# Patient Record
Sex: Male | Born: 2000 | Race: Black or African American | Hispanic: No | Marital: Single | Smoking: Never smoker
Health system: Southern US, Community
[De-identification: ages and names within clinical notes are randomized; demographics above are authoritative.]

## PROBLEM LIST (undated history)

## (undated) HISTORY — PX: HERNIA REPAIR: SHX51

---
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
## 2018-01-30 ENCOUNTER — Other Ambulatory Visit: Payer: Self-pay

## 2018-01-30 ENCOUNTER — Ambulatory Visit (HOSPITAL_COMMUNITY)
Admission: EM | Admit: 2018-01-30 | Discharge: 2018-01-30 | Disposition: A | Discharge status: Home / Self Care | Payer: Self-pay | Attending: Family Medicine | Admitting: Family Medicine

## 2018-01-30 ENCOUNTER — Encounter (HOSPITAL_COMMUNITY): Payer: Self-pay

## 2018-01-30 DIAGNOSIS — Z202 Contact with and (suspected) exposure to infections with a predominantly sexual mode of transmission: Secondary | ICD-10-CM | POA: Insufficient documentation

## 2018-01-30 DIAGNOSIS — Z7251 High risk heterosexual behavior: Secondary | ICD-10-CM

## 2018-01-30 DIAGNOSIS — Z113 Encounter for screening for infections with a predominantly sexual mode of transmission: Secondary | ICD-10-CM | POA: Insufficient documentation

## 2018-01-30 MED ORDER — AZITHROMYCIN 250 MG PO TABS
1000.0000 mg | ORAL_TABLET | Freq: Once | ORAL | Status: AC
  Administered 2018-01-30: 1000 mg via ORAL

## 2018-01-30 MED ORDER — AZITHROMYCIN 250 MG PO TABS
ORAL_TABLET | ORAL | Status: AC
  Filled 2018-01-30: qty 4

## 2018-01-30 MED ORDER — CEFTRIAXONE SODIUM 250 MG IJ SOLR
INTRAMUSCULAR | Status: AC
  Filled 2018-01-30: qty 250

## 2018-01-30 MED ORDER — CEFTRIAXONE SODIUM 250 MG IJ SOLR
250.0000 mg | Freq: Once | INTRAMUSCULAR | Status: AC
  Administered 2018-01-30: 250 mg via INTRAMUSCULAR

## 2018-01-30 MED ORDER — LIDOCAINE HCL (PF) 1 % IJ SOLN
INTRAMUSCULAR | Status: AC
  Filled 2018-01-30: qty 2

## 2018-01-30 NOTE — Discharge Instructions (Addendum)
Given rocephin 250mg  injection and azithromycin 1g in office Urine cytology obtained Declines HIV/ syphilis testing today We will follow up with you regarding the results of your test If tests are positive, please abstain from sexual activity for at least 7 days and notify partners Follow up with PCP or [facility] if symptoms persists Return here or go to ER if you have any new or worsening symptoms

## 2018-01-30 NOTE — ED Provider Notes (Signed)
[facility]   [id number] 01/30/18 Arrival Time: 1626   [id number] to STD  SUBJECTIVE:  Christopher Larsen is a 17 y.o. male who presents requesting STI screening.  Last unprotected sexual encounter on 01/27/18.  Sexually active with 1 male partner.  Currently asymptomatic.  Partner told patient she was positive for chlamydia.  Denies fever, chills, nausea, vomiting, abdominal pain, urinary symptoms, penile itching, penile rashes or lesions, testicular pain, or testicular swelling.    No LMP for male patient.  ROS: As per HPI.  History reviewed. No pertinent past medical history. Past Surgical History:  Procedure Laterality Date  . HERNIA REPAIR     No Known Allergies No current facility-administered medications on file prior to encounter.    Current Outpatient Medications on File Prior to Encounter  Medication Sig Dispense Refill  . hydrocortisone 2.5 % lotion Apply topically 2 (two) times daily. 59 mL 0  . ibuprofen (ADVIL,MOTRIN) 600 MG tablet Take 1 tablet (600 mg total) by mouth every 6 (six) hours as needed. 30 tablet 0  . neomycin-bacitracin-polymyxin (NEOSPORIN) ointment Apply 1 application topically daily as needed for wound care. apply to eye     Social History   Socioeconomic History  . Marital status: Single    Spouse name: Not on file  . Number of children: Not on file  . Years of education: Not on file  . Highest education level: Not on file  Occupational History  . Not on file  Social Needs  . Financial resource strain: Not on file  . Food insecurity:    Worry: Not on file    Inability: Not on file  . Transportation needs:    Medical: Not on file    Non-medical: Not on file  Tobacco Use  . Smoking status: Never Smoker  . Smokeless tobacco: Never Used  Substance and Sexual Activity  . Alcohol use: No  . Drug use: No  . Sexual activity: Not on file  Lifestyle  . Physical activity:    Days per week: Not on file    Minutes per session: Not  on file  . Stress: Not on file  Relationships  . Social connections:    Talks on phone: Not on file    Gets together: Not on file    Attends religious service: Not on file    Active member of club or organization: Not on file    Attends meetings of clubs or organizations: Not on file    Relationship status: Not on file  . Intimate partner violence:    Fear of current or ex partner: Not on file    Emotionally abused: Not on file    Physically abused: Not on file    Forced sexual activity: Not on file  Other Topics Concern  . Not on file  Social History Narrative  . Not on file   History reviewed. No pertinent family history.  OBJECTIVE:  Vitals:   01/30/18 1708  BP: 105/66  Pulse: 63  Resp: 16  Temp: 97.8 F (36.6 C)  TempSrc: Oral  SpO2: 99%     General appearance: alert, cooperative, appears stated age and no distress Throat: lips, mucosa, and tongue normal; teeth and gums normal Lungs: CTA bilaterally without adventitious breath sounds Heart: regular rate and rhythm.  Radial pulses 2+ symmetrical bilaterally Back: no CVA tenderness Abdomen: soft, non-tender; bowel sounds normal Skin: warm and dry Psychological:  Alert and cooperative. Normal mood and affect.  ASSESSMENT & PLAN:  1.  STD exposure   2. Unprotected sex     Meds ordered this encounter  Medications  . cefTRIAXone (ROCEPHIN) injection 250 mg  . azithromycin (ZITHROMAX) tablet 1,000 mg    Pending: Labs Reviewed  URINE CYTOLOGY ANCILLARY ONLY   Given rocephin 250mg  injection and azithromycin 1g in office Urine cytology obtained Declines HIV/ syphilis testing today We will follow up with you regarding the results of your test If tests are positive, please abstain from sexual activity for at least 7 days and notify partners Follow up with PCP or [facility] if symptoms persists Return here or go to ER if you have any new or worsening symptoms      Reviewed expectations re:  course of current medical issues. Questions answered. Outlined signs and symptoms indicating need for more acute intervention. Patient verbalized understanding. After Visit Summary given.       Rennis Harding, PA-C 01/30/18 1728

## 2018-01-30 NOTE — ED Triage Notes (Signed)
Pt presents to [facility] for STD testing, pt has been exposed to Chlamydia, pt states partner told him she was positive, last time he had sexual intercourse was Thursday 01/27/18.pt denies any symptoms

## 2018-02-01 ENCOUNTER — Telehealth (HOSPITAL_COMMUNITY): Payer: Self-pay

## 2018-02-01 LAB — URINE CYTOLOGY ANCILLARY ONLY
Chlamydia: POSITIVE — AB
Neisseria Gonorrhea: NEGATIVE

## 2018-02-01 NOTE — Telephone Encounter (Signed)
Chlamydia is positive.  This was treated at the urgent care visit with po zithromax 1g.  Pt contacted and made aware of results. Educated pt to please refrain from sexual intercourse for 7 days to give the medicine time to work.  Sexual partners need to be notified and tested/treated.  Condoms may reduce risk of reinfection.  Recheck or followup with PCP for further evaluation if symptoms are not improving.  [facility] notified 

## 2018-05-06 ENCOUNTER — Emergency Department (HOSPITAL_COMMUNITY)
Admission: EM | Admit: 2018-05-06 | Discharge: 2018-05-06 | Disposition: A | Discharge status: Home / Self Care | Payer: Self-pay | Attending: Emergency Medicine | Admitting: Emergency Medicine

## 2018-05-06 ENCOUNTER — Encounter (HOSPITAL_COMMUNITY): Payer: Self-pay | Admitting: Emergency Medicine

## 2018-05-06 ENCOUNTER — Emergency Department (HOSPITAL_COMMUNITY): Payer: Self-pay

## 2018-05-06 DIAGNOSIS — R519 Headache, unspecified: Secondary | ICD-10-CM

## 2018-05-06 DIAGNOSIS — R51 Headache: Secondary | ICD-10-CM | POA: Insufficient documentation

## 2018-05-06 MED ORDER — SUMATRIPTAN SUCCINATE 100 MG PO TABS: 100.0000 mg | ORAL_TABLET | ORAL | 0 refills | Status: DC | PRN

## 2018-05-06 MED ORDER — METOCLOPRAMIDE HCL 5 MG/ML IJ SOLN
10.0000 mg | Freq: Once | INTRAMUSCULAR | Status: AC
  Administered 2018-05-06: 10 mg via INTRAVENOUS
  Filled 2018-05-06: qty 2

## 2018-05-06 MED ORDER — SODIUM CHLORIDE 0.9 % IV BOLUS
1000.0000 mL | Freq: Once | INTRAVENOUS | Status: AC
  Administered 2018-05-06: 1000 mL via INTRAVENOUS

## 2018-05-06 MED ORDER — KETOROLAC TROMETHAMINE 30 MG/ML IJ SOLN
30.0000 mg | Freq: Once | INTRAMUSCULAR | Status: AC
  Administered 2018-05-06: 30 mg via INTRAVENOUS
  Filled 2018-05-06: qty 1

## 2018-05-06 MED ORDER — DIPHENHYDRAMINE HCL 50 MG/ML IJ SOLN
25.0000 mg | Freq: Once | INTRAMUSCULAR | Status: AC
  Administered 2018-05-06: 25 mg via INTRAVENOUS
  Filled 2018-05-06: qty 1

## 2018-05-06 NOTE — Discharge Instructions (Addendum)
CT scan showed no serious findings.  Can take Tylenol and/or ibuprofen for headache.  Prescription for Imitrex.  Make sure you read the package insert before taking the medicine.

## 2018-05-06 NOTE — ED Provider Notes (Signed)
[facility] EMERGENCY DEPARTMENT Provider Note   CSN: [id number] Arrival date & time: 05/06/18  1050     History   Chief Complaint Chief Complaint  Patient presents with  . Headache    HPI Christopher Larsen is a 17 y.o. male.  Intermittent right sided headache for 1 month.  Symptoms initially originated around the right eye and have migrated to the right temple area.  No obvious injury or aggravating circumstances.  He is sensitive to light and sound and describes an aura prior to pain onset.  No stiff neck or extremity numbness or weakness.  He is normally healthy.  Severity is moderate.  Nothing makes symptoms better or worse.     History reviewed. No pertinent past medical history.  Patient Active Problem List   Diagnosis Date Noted  . DIZZINESS 08/13/2008  . DYSARTHRIA 08/13/2008  . HEADACHE 08/06/2008  . DERMATITIS, ATOPIC 05/19/2007  . RHINITIS, ALLERGIC 07/29/2006    Past Surgical History:  Procedure Laterality Date  . HERNIA REPAIR          Home Medications    Prior to Admission medications   Medication Sig Start Date End Date Taking? Authorizing Provider  hydrocortisone 2.5 % lotion Apply topically 2 (two) times daily. 02/20/15   Santiago Glad, PA-C  ibuprofen (ADVIL,MOTRIN) 600 MG tablet Take 1 tablet (600 mg total) by mouth every 6 (six) hours as needed. 01/18/16   Sam, Ace Gins, PA-C  neomycin-bacitracin-polymyxin (NEOSPORIN) ointment Apply 1 application topically daily as needed for wound care. apply to eye    [provider]  SUMAtriptan (IMITREX) 100 MG tablet Take 1 tablet (100 mg total) by mouth every 2 (two) hours as needed for migraine. May repeat in 2 hours if headache persists or recurs. 05/06/18   Donnetta Hutching, MD    Family History History reviewed. No pertinent family history.  Social History Social History   Tobacco Use  . Smoking status: Never Smoker  . Smokeless tobacco: Never Used  Substance Use Topics    . Alcohol use: No  . Drug use: No     Allergies   Patient has no known allergies.   Review of Systems Review of Systems  All other systems reviewed and are negative.    Physical Exam Updated Vital Signs BP 107/61 (BP Location: Right Arm)   Pulse 79   Temp 98.5 F (36.9 C) (Oral)   Resp 16   SpO2 100%   Physical Exam  Constitutional: He is oriented to person, place, and time.  Photophobic  HENT:  Head: Normocephalic and atraumatic.  Eyes: Conjunctivae are normal.  Neck: Neck supple.  Cardiovascular: Normal rate and regular rhythm.  Pulmonary/Chest: Effort normal and breath sounds normal.  Abdominal: Soft. Bowel sounds are normal.  Musculoskeletal: Normal range of motion.  Neurological: He is alert and oriented to person, place, and time.  Skin: Skin is warm and dry.  Psychiatric: He has a normal mood and affect. His behavior is normal.  Nursing note and vitals reviewed.    ED Treatments / Results  Labs (all labs ordered are listed, but only abnormal results are displayed) Labs Reviewed - No data to display  EKG None  Radiology Ct Head Wo Contrast  Result Date: 05/06/2018 CLINICAL DATA:  17 year old male with acute RIGHT headache for several weeks. EXAM: CT HEAD WITHOUT CONTRAST TECHNIQUE: Contiguous axial images were obtained from the base of the skull through the vertex without intravenous contrast. COMPARISON:  09/05/2008 CT FINDINGS: Brain:  No evidence of acute infarction, hemorrhage, hydrocephalus, extra-axial collection or mass lesion/mass effect. Vascular: No hyperdense vessel or unexpected calcification. Skull: Normal. Negative for fracture or focal lesion. Sinuses/Orbits: No acute finding. Other: None. IMPRESSION: Unremarkable noncontrast head CT Electronically Signed   By: Harmon PierJeffrey  Hu M.D.   On: 05/06/2018 13:18    Procedures Procedures (including critical care time)  Medications Ordered in ED Medications  sodium chloride 0.9 % bolus 1,000 mL  (1,000 mLs Intravenous New Bag/Given 05/06/18 1233)  metoCLOPramide (REGLAN) injection 10 mg (10 mg Intravenous Given 05/06/18 1237)  ketorolac (TORADOL) 30 MG/ML injection 30 mg (30 mg Intravenous Given 05/06/18 1236)  diphenhydrAMINE (BENADRYL) injection 25 mg (25 mg Intravenous Given 05/06/18 1233)     Initial Impression / Assessment and Plan / ED Course  I have reviewed the triage vital signs and the nursing notes.  Pertinent labs & imaging results that were available during my care of the patient were reviewed by me and considered in my medical decision making (see chart for details).     Patient presents with headache on the right side of the face.  No obvious neurological deficits.  CT head negative.  He responded well to IV fluids, IV Reglan, IV Benadryl, IV Toradol.  Discharge medication Imitrex.  Discussed findings with the patient.  Final Clinical Impressions(s) / ED Diagnoses   Final diagnoses:  Intractable headache, unspecified chronicity pattern, unspecified headache type    ED Discharge Orders         Ordered    SUMAtriptan (IMITREX) 100 MG tablet  Every 2 hours PRN     05/06/18 1426           Donnetta Hutchingook, Vincy Feliz, MD 05/06/18 1524

## 2018-05-06 NOTE — ED Triage Notes (Signed)
Pt presents to ED for assessment of frontal headaches over and behind his right eye since the beginning of November.  Denies any known injury.  C/o sensitivity to light and sound, and aura prior to intense pain coming on.

## 2018-05-06 NOTE — ED Notes (Signed)
Patient transported to CT 

## 2019-10-27 ENCOUNTER — Encounter (HOSPITAL_COMMUNITY): Payer: Self-pay | Admitting: Emergency Medicine

## 2019-10-27 ENCOUNTER — Emergency Department (HOSPITAL_COMMUNITY): Payer: Self-pay

## 2019-10-27 ENCOUNTER — Other Ambulatory Visit: Payer: Self-pay

## 2019-10-27 ENCOUNTER — Emergency Department (HOSPITAL_COMMUNITY)
Admission: EM | Admit: 2019-10-27 | Discharge: 2019-10-27 | Disposition: A | Discharge status: Home / Self Care | Payer: Self-pay | Attending: Emergency Medicine | Admitting: Emergency Medicine

## 2019-10-27 DIAGNOSIS — Z5321 Procedure and treatment not carried out due to patient leaving prior to being seen by health care provider: Secondary | ICD-10-CM | POA: Insufficient documentation

## 2019-10-27 DIAGNOSIS — R0602 Shortness of breath: Secondary | ICD-10-CM | POA: Insufficient documentation

## 2019-10-27 DIAGNOSIS — R0789 Other chest pain: Secondary | ICD-10-CM | POA: Insufficient documentation

## 2019-10-27 LAB — BASIC METABOLIC PANEL
Anion gap: 9 (ref 5–15)
BUN: 16 mg/dL (ref 6–20)
CO2: 24 mmol/L (ref 22–32)
Calcium: 9.3 mg/dL (ref 8.9–10.3)
Chloride: 106 mmol/L (ref 98–111)
Creatinine, Ser: 0.95 mg/dL (ref 0.61–1.24)
GFR calc Af Amer: >60 mL/min (ref >60)
GFR calc non Af Amer: >60 mL/min (ref >60)
Glucose, Bld: 81 mg/dL (ref 70–99)
Potassium: 3.6 mmol/L (ref 3.5–5.1)
Sodium: 139 mmol/L (ref 135–145)

## 2019-10-27 LAB — CBC
HCT: 48.2 % (ref 39.0–52.0)
Hemoglobin: 15.6 g/dL (ref 13.0–17.0)
MCH: 30.6 pg (ref 26.0–34.0)
MCHC: 32.4 g/dL (ref 30.0–36.0)
MCV: 94.7 fL (ref 80.0–100.0)
Platelets: 161 10*3/uL (ref 150–400)
RBC: 5.09 MIL/uL (ref 4.22–5.81)
RDW: 12.3 % (ref 11.5–15.5)
WBC: 4.8 10*3/uL (ref 4.0–10.5)
nRBC: 0 % (ref 0.0–0.2)

## 2019-10-27 LAB — TROPONIN I (HIGH SENSITIVITY): Troponin I (High Sensitivity): 2 ng/L (ref <18)

## 2019-10-27 MED ORDER — SODIUM CHLORIDE 0.9% FLUSH: 3.0000 mL | Freq: Once | INTRAVENOUS | Status: DC

## 2019-10-27 NOTE — ED Triage Notes (Signed)
Pt c/o left side chest pains that started today causing SOB.

## 2020-06-13 ENCOUNTER — Ambulatory Visit: Payer: Self-pay | Admitting: Internal Medicine

## 2020-06-13 DIAGNOSIS — Z0289 Encounter for other administrative examinations: Secondary | ICD-10-CM

## 2020-09-23 IMAGING — CT CT HEAD W/O CM
4 series · 16 of 47 positions shown, 18 images · non-contrast
Comparison: 09/05/2008 CT

CLINICAL DATA: 17-year-old male with acute RIGHT headache for
several weeks.

EXAM:
CT HEAD WITHOUT CONTRAST
TECHNIQUE: Contiguous axial images were obtained from the base of the skull
through the vertex without intravenous contrast.

[Series 4: head without · axial · non-contrast · 0.46mm/px · z∈[-128,-8]mm · 7 of 34 slices shown, 9 images]
[im 5/34  brain]
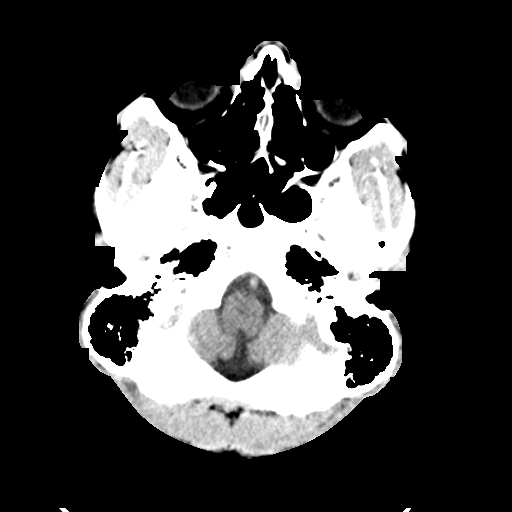
[im 5/34  bone]
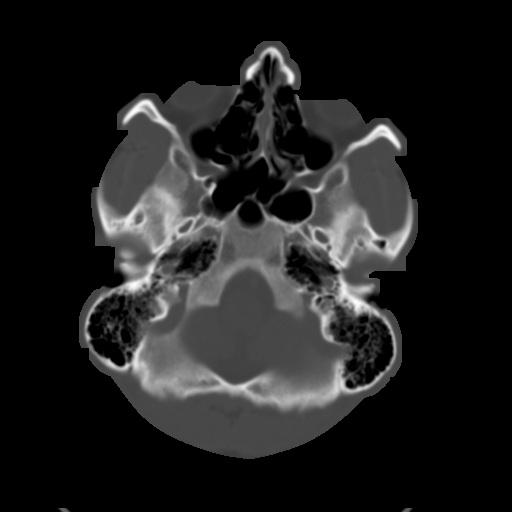
[im 9/34  brain]
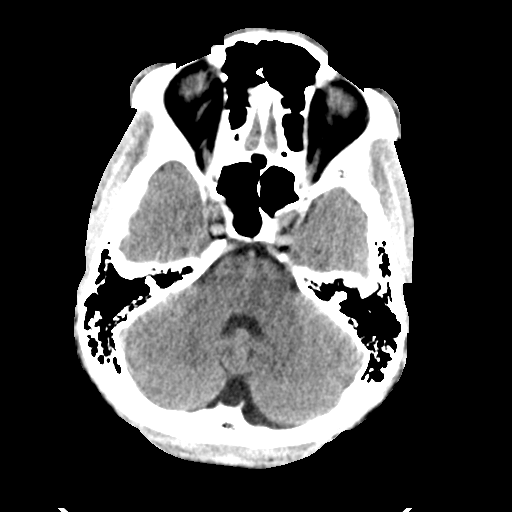
[im 13/34  brain]
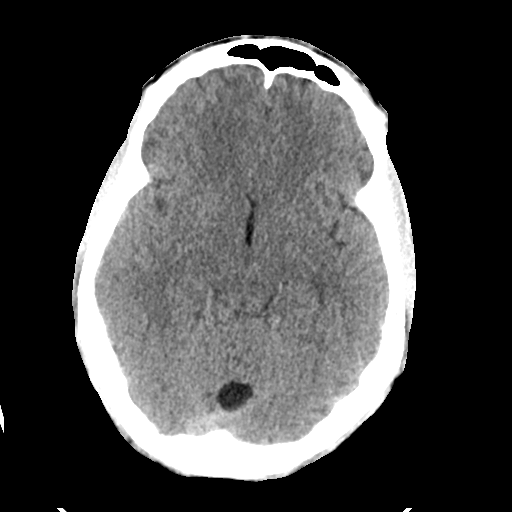
[im 17/34  brain]
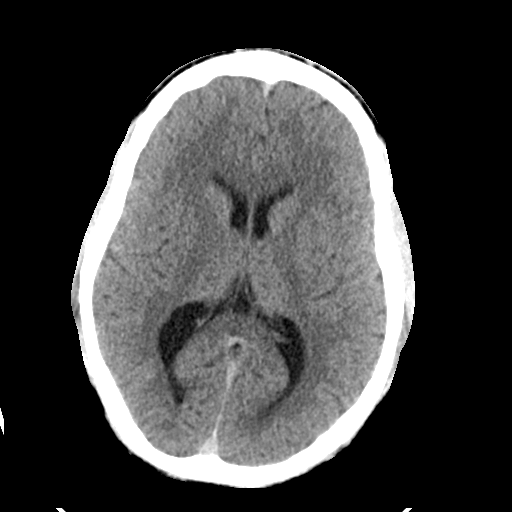
[im 21/34  brain]
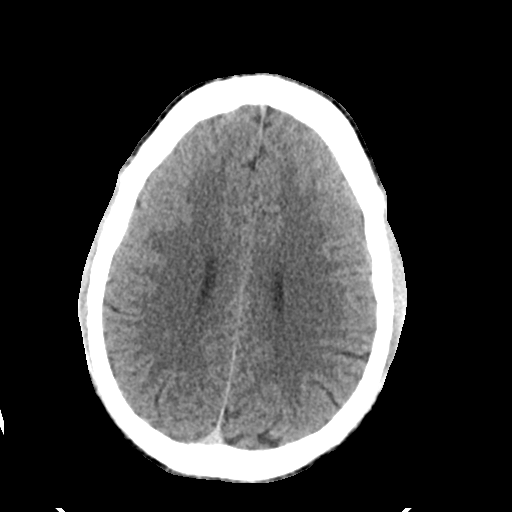
[im 21/34  bone]
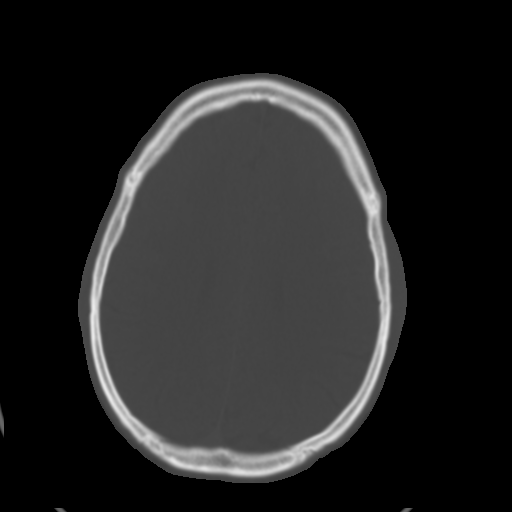
[im 25/34  brain]
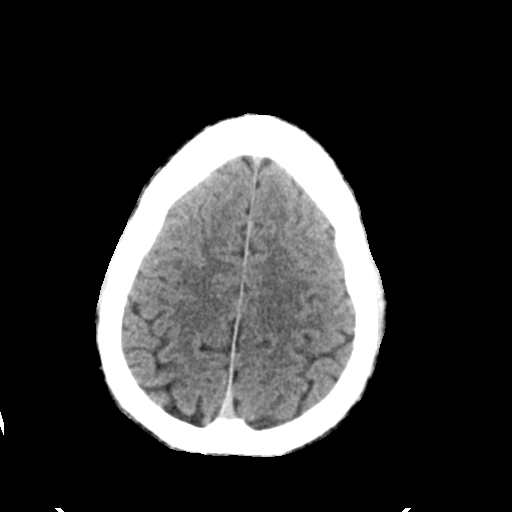
[im 29/34  brain]
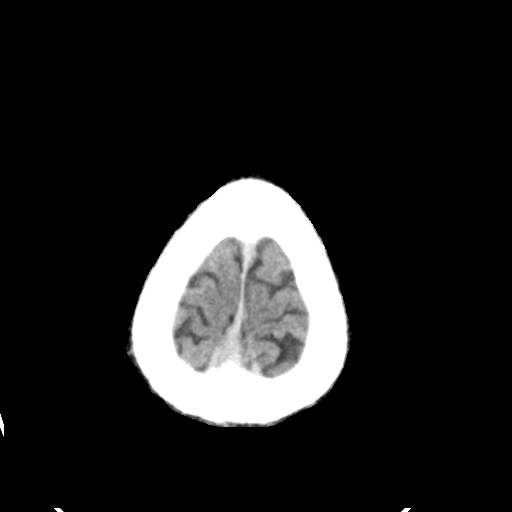

[Series 5: head bone · axial · 0.46mm/px · z∈[-132,-98]mm · 3 of 85 slices shown]
[im 9/85  bone]
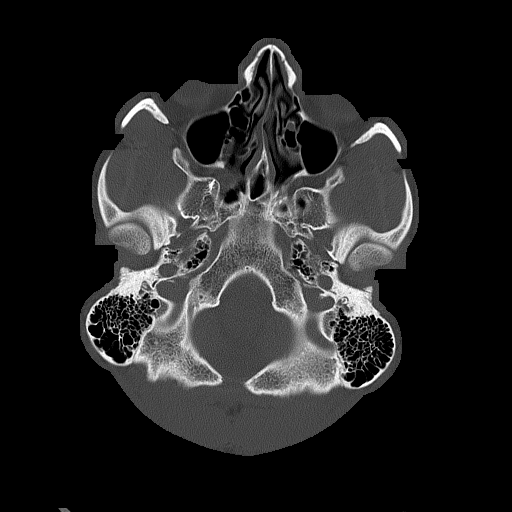
[im 17/85  bone]
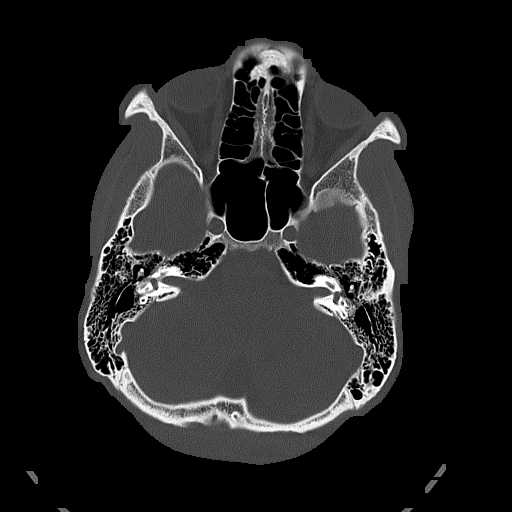
[im 26/85  bone]
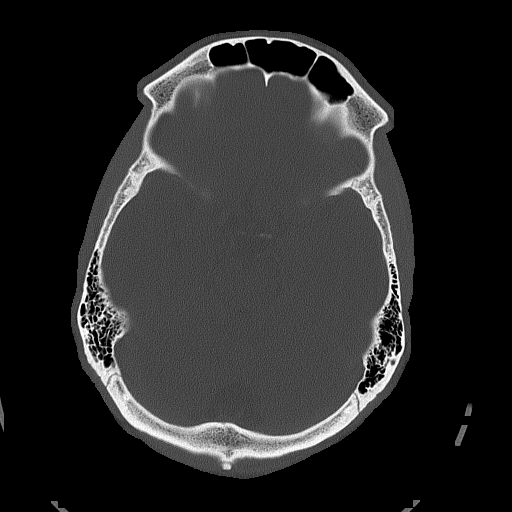

[Series 6: head without cor · coronal · non-contrast · 0.35mm/px · 3 of 81 slices shown]
[im 27/81  brain]
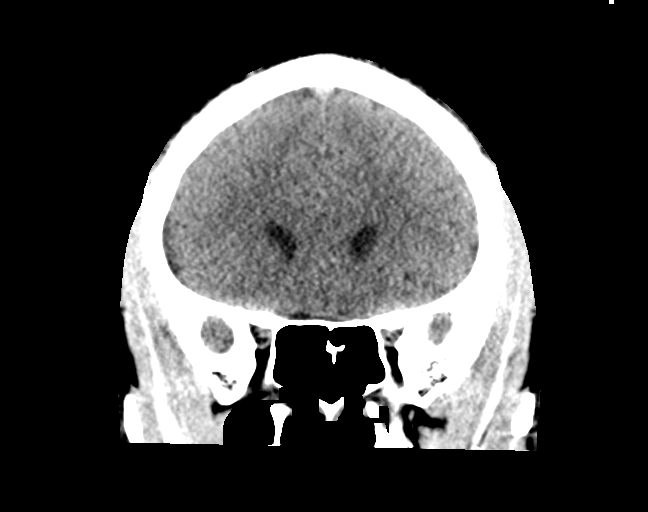
[im 36/81  brain]
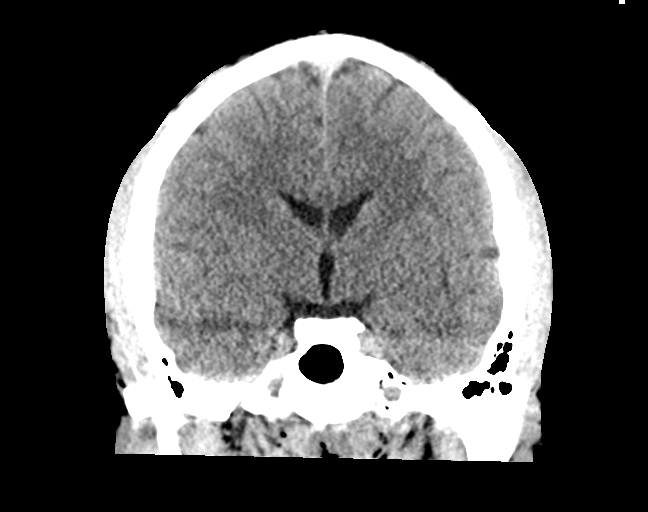
[im 45/81  brain]
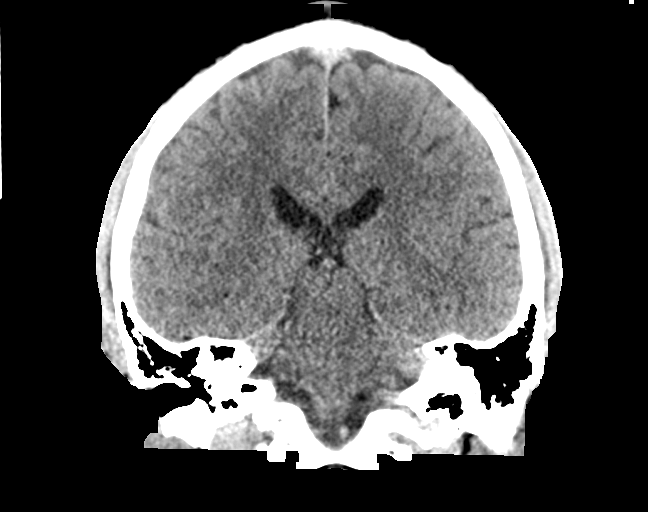

[Series 7: head without sag · sagittal · non-contrast · 0.35mm/px · 3 of 67 slices shown]
[im 23/67  brain]
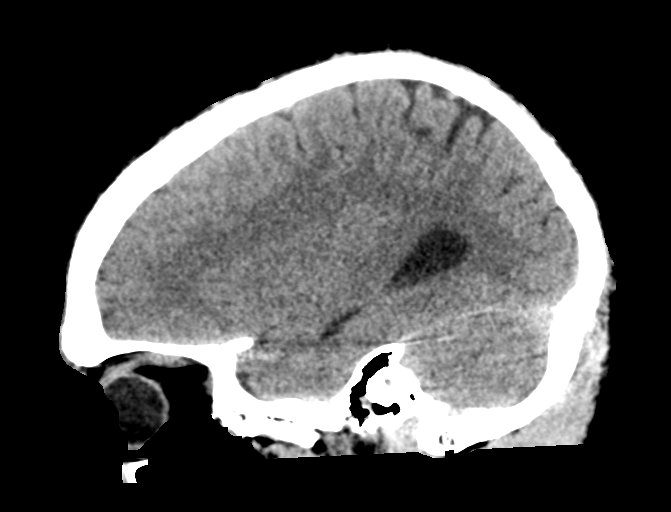
[im 34/67  brain]
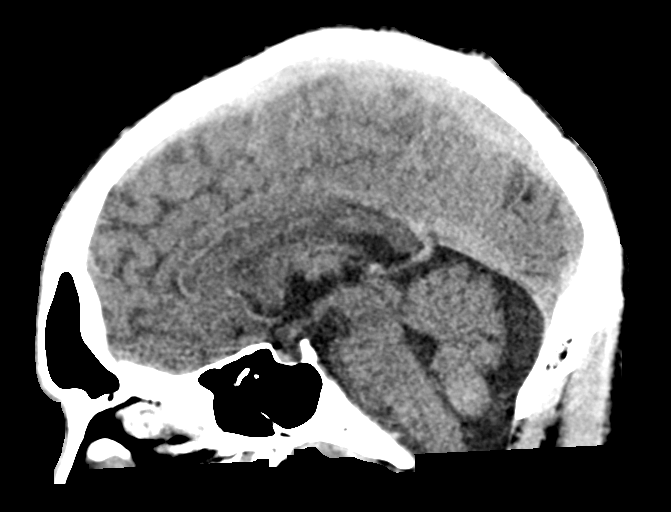
[im 45/67  brain]
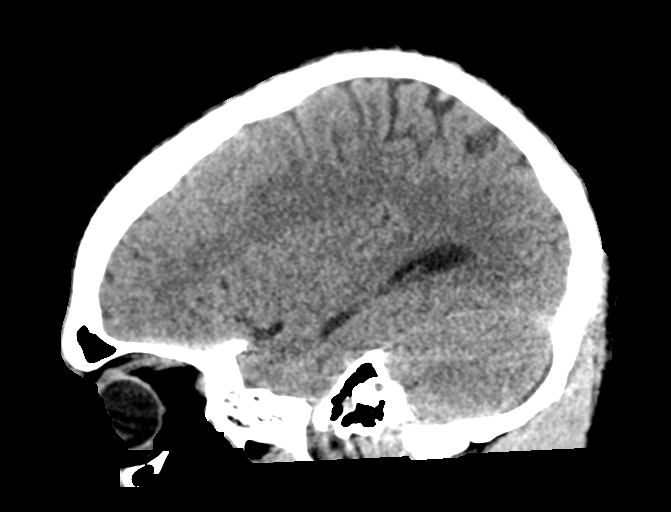

[16 of 47 positions shown; findings below may reference images not displayed]

FINDINGS: Brain: No evidence of acute infarction, hemorrhage, hydrocephalus,
extra-axial collection or mass lesion/mass effect.

Vascular: No hyperdense vessel or unexpected calcification.

Skull: Normal. Negative for fracture or focal lesion.

Sinuses/Orbits: No acute finding.

Other: None.
IMPRESSION: Unremarkable noncontrast head CT

## 2020-10-07 ENCOUNTER — Encounter (HOSPITAL_COMMUNITY): Payer: Self-pay

## 2020-10-07 ENCOUNTER — Ambulatory Visit (HOSPITAL_COMMUNITY)
Admission: EM | Admit: 2020-10-07 | Discharge: 2020-10-07 | Disposition: A | Discharge status: Home / Self Care | Payer: Self-pay | Attending: Family Medicine | Admitting: Family Medicine

## 2020-10-07 ENCOUNTER — Other Ambulatory Visit: Payer: Self-pay

## 2020-10-07 DIAGNOSIS — Z113 Encounter for screening for infections with a predominantly sexual mode of transmission: Secondary | ICD-10-CM | POA: Insufficient documentation

## 2020-10-07 NOTE — Discharge Instructions (Addendum)
We have sent testing for sexually transmitted infections. We will notify you of any positive results once they are received. If required, we will prescribe any medications you might need.  Please refrain from all sexual activity for at least the next seven days.  

## 2020-10-07 NOTE — ED Triage Notes (Signed)
Pt is here today to be tested for STD. He denies sxs.

## 2020-10-08 LAB — CYTOLOGY, (ORAL, ANAL, URETHRAL) ANCILLARY ONLY
Chlamydia: NEGATIVE
Comment: NEGATIVE
Comment: NEGATIVE
Comment: NORMAL
Neisseria Gonorrhea: NEGATIVE
Trichomonas: POSITIVE — AB

## 2020-10-09 ENCOUNTER — Telehealth (HOSPITAL_COMMUNITY): Payer: Self-pay | Admitting: Family Medicine

## 2020-10-09 ENCOUNTER — Telehealth (HOSPITAL_COMMUNITY): Payer: Self-pay | Admitting: Emergency Medicine

## 2020-10-09 MED ORDER — METRONIDAZOLE 500 MG PO TABS: 500.0000 mg | ORAL_TABLET | Freq: Two times a day (BID) | ORAL | 0 refills | Status: DC

## 2020-10-09 NOTE — Telephone Encounter (Signed)
Error

## 2020-10-09 NOTE — ED Provider Notes (Signed)
  [facility]   [id number] 10/07/20 Arrival Time: 1131  ASSESSMENT & PLAN:  1. Screening for STDs (sexually transmitted diseases)    No empiric tx warranted.   Discharge Instructions     We have sent testing for sexually transmitted infections. We will notify you of any positive results once they are received. If required, we will prescribe any medications you might need.  Please refrain from all sexual activity for at least the next seven days.     Pending: Urethral cytology.  Will notify of any positive results. Instructed to refrain from sexual activity for at least seven days.  Reviewed expectations re: course of current medical issues. Questions answered. Outlined signs and symptoms indicating need for more acute intervention. Patient verbalized understanding. After Visit Summary given.   SUBJECTIVE:  Christopher Larsen is a 20 y.o. male who requests STD screening. No symptoms. Afebrile. No abdominal or pelvic pain. No n/v. No rashes or lesions. Reports that he is sexually active. OTC treatment: none. History of STI: none reported.    OBJECTIVE:  Vitals:   10/07/20 1245  BP: 130/78  Pulse: 82  Resp: 19  Temp: 97.8 F (36.6 C)  TempSrc: Oral  SpO2: 98%     General appearance: alert, cooperative, appears stated age and no distress Lungs: unlabored respirations; speaks full sentences without difficulty Abdomen: soft, non-tender GU: normal appearing genitalia Skin: warm and dry Psychological: alert and cooperative; normal mood and affect.    No Known Allergies  History reviewed. No pertinent past medical history. History reviewed. No pertinent family history. Social History   Socioeconomic History  . Marital status: Single    Spouse name: Not on file  . Number of children: Not on file  . Years of education: Not on file  . Highest education level: Not on file  Occupational History  . Not on file  Tobacco Use  . Smoking status: Never  Smoker  . Smokeless tobacco: Never Used  Substance and Sexual Activity  . Alcohol use: No  . Drug use: No  . Sexual activity: Not on file  Other Topics Concern  . Not on file  Social History Narrative  . Not on file   Social Determinants of Health   Financial Resource Strain: Not on file  Food Insecurity: Not on file  Transportation Needs: Not on file  Physical Activity: Not on file  Stress: Not on file  Social Connections: Not on file  Intimate Partner Violence: Not on file          Mardella Layman, MD 10/09/20 1054

## 2020-11-25 ENCOUNTER — Ambulatory Visit (HOSPITAL_COMMUNITY)
Admission: RE | Admit: 2020-11-25 | Discharge: 2020-11-25 | Disposition: A | Discharge status: Home / Self Care | Payer: Self-pay | Source: Ambulatory Visit | Attending: Family Medicine | Admitting: Family Medicine

## 2020-11-25 ENCOUNTER — Encounter (HOSPITAL_COMMUNITY): Payer: Self-pay

## 2020-11-25 ENCOUNTER — Other Ambulatory Visit: Payer: Self-pay

## 2020-11-25 VITALS — BP 130/89 | HR 101 | Temp 98.2°F | Resp 18

## 2020-11-25 DIAGNOSIS — R369 Urethral discharge, unspecified: Secondary | ICD-10-CM | POA: Insufficient documentation

## 2020-11-25 MED ORDER — LIDOCAINE HCL (PF) 1 % IJ SOLN
INTRAMUSCULAR | Status: AC
  Filled 2020-11-25: qty 2

## 2020-11-25 MED ORDER — CEFTRIAXONE SODIUM 500 MG IJ SOLR
500.0000 mg | Freq: Once | INTRAMUSCULAR | Status: AC
  Administered 2020-11-25: 12:00:00 500 mg via INTRAMUSCULAR

## 2020-11-25 MED ORDER — CEFTRIAXONE SODIUM 500 MG IJ SOLR
INTRAMUSCULAR | Status: AC
  Filled 2020-11-25: qty 500

## 2020-11-25 NOTE — ED Provider Notes (Signed)
[facility]    CSN: [id number] Arrival date & time: 11/25/20  1041      History   Chief Complaint Chief Complaint  Patient presents with   discharge     HPI Christopher Larsen is a 20 y.o. male.   Patient presenting today with 2-day history of yellow penile discharge.  Denies itching, dysuria, rashes, pelvic pain, nausea vomiting, fevers.  Recently started with a new sexual partner though no known exposures specifically.  Not try anything over-the-counter for symptoms thus far   History reviewed. No pertinent past medical history.  Patient Active Problem List   Diagnosis Date Noted   DIZZINESS 08/13/2008   DYSARTHRIA 08/13/2008   HEADACHE 08/06/2008   DERMATITIS, ATOPIC 05/19/2007   RHINITIS, ALLERGIC 07/29/2006    Past Surgical History:  Procedure Laterality Date   HERNIA REPAIR         Home Medications    Prior to Admission medications   Medication Sig Start Date End Date Taking? Authorizing Provider  hydrocortisone 2.5 % lotion Apply topically 2 (two) times daily. 02/20/15   Santiago Glad, PA-C  ibuprofen (ADVIL,MOTRIN) 600 MG tablet Take 1 tablet (600 mg total) by mouth every 6 (six) hours as needed. 01/18/16   Sam, Ace Gins, PA-C  metroNIDAZOLE (FLAGYL) 500 MG tablet Take 1 tablet (500 mg total) by mouth 2 (two) times daily. 10/09/20   Lamptey, Britta Mccreedy, MD  neomycin-bacitracin-polymyxin (NEOSPORIN) ointment Apply 1 application topically daily as needed for wound care. apply to eye    [provider]  SUMAtriptan (IMITREX) 100 MG tablet Take 1 tablet (100 mg total) by mouth every 2 (two) hours as needed for migraine. May repeat in 2 hours if headache persists or recurs. 05/06/18   Donnetta Hutching, MD    Family History Family History  Family history unknown: Yes    Social History Social History   Tobacco Use   Smoking status: Never   Smokeless tobacco: Never  Vaping Use   Vaping Use: Never used  Substance Use Topics   Alcohol use: Yes    Drug use: No     Allergies   Patient has no known allergies.   Review of Systems Review of Systems Per HPI  Physical Exam Triage Vital Signs ED Triage Vitals  Enc Vitals Group     BP 11/25/20 1138 130/89     Pulse Rate 11/25/20 1138 (!) 101     Resp 11/25/20 1138 18     Temp 11/25/20 1138 98.2 F (36.8 C)     Temp Source 11/25/20 1138 Oral     SpO2 11/25/20 1138 97 %     Weight --      Height --      Head Circumference --      Peak Flow --      Pain Score 11/25/20 1135 0     Pain Loc --      Pain Edu? --      Excl. in GC? --    No data found.  Updated Vital Signs BP 130/89 (BP Location: Right Arm)   Pulse (!) 101   Temp 98.2 F (36.8 C) (Oral)   Resp 18   SpO2 97%   Visual Acuity Right Eye Distance:   Left Eye Distance:   Bilateral Distance:    Right Eye Near:   Left Eye Near:    Bilateral Near:     Physical Exam Vitals and nursing note reviewed.  Constitutional:  Appearance: Normal appearance.  HENT:     Head: Atraumatic.  Eyes:     Extraocular Movements: Extraocular movements intact.     Conjunctiva/sclera: Conjunctivae normal.  Cardiovascular:     Rate and Rhythm: Normal rate and regular rhythm.  Pulmonary:     Effort: Pulmonary effort is normal.     Breath sounds: Normal breath sounds.  Genitourinary:    Comments: GU exam deferred, self swab performed Musculoskeletal:        General: Normal range of motion.     Cervical back: Normal range of motion and neck supple.  Skin:    General: Skin is warm and dry.  Neurological:     General: No focal deficit present.     Mental Status: He is oriented to person, place, and time.  Psychiatric:        Mood and Affect: Mood normal.        Thought Content: Thought content normal.        Judgment: Judgment normal.   UC Treatments / Results  Labs (all labs ordered are listed, but only abnormal results are displayed) Labs Reviewed  CYTOLOGY, (ORAL, ANAL, URETHRAL) ANCILLARY ONLY    EKG  Radiology No results found.  Procedures Procedures (including critical care time)  Medications Ordered in UC Medications  cefTRIAXone (ROCEPHIN) injection 500 mg (500 mg Intramuscular Given 11/25/20 1153)    Initial Impression / Assessment and Plan / UC Course  I have reviewed the triage vital signs and the nursing notes.  Pertinent labs & imaging results that were available during my care of the patient were reviewed by me and considered in my medical decision making (see chart for details).     Cytology swab pending, treat with IM Rocephin in the meantime given symptoms and new sexual partner.  Discussed abstinence until results return and at least 7 days posttreatment of positive for anything.  Safe sexual practices reviewed. Final Clinical Impressions(s) / UC Diagnoses   Final diagnoses:  Penile discharge   Discharge Instructions   None    ED Prescriptions   None    PDMP not reviewed this encounter.   Particia Nearing, [place] 11/25/20 1232

## 2020-11-25 NOTE — ED Triage Notes (Signed)
Pt c/o having yellow discharge from penis, no pain nor itching noted. Discharge started two days ago. Patient requesting to have STD testing done.

## 2020-11-26 LAB — CYTOLOGY, (ORAL, ANAL, URETHRAL) ANCILLARY ONLY
Chlamydia: NEGATIVE
Comment: NEGATIVE
Comment: NEGATIVE
Comment: NORMAL
Neisseria Gonorrhea: POSITIVE — AB
Trichomonas: POSITIVE — AB

## 2020-11-27 ENCOUNTER — Telehealth (HOSPITAL_COMMUNITY): Payer: Self-pay | Admitting: Emergency Medicine

## 2020-11-27 MED ORDER — METRONIDAZOLE 500 MG PO TABS: 500.0000 mg | ORAL_TABLET | Freq: Two times a day (BID) | ORAL | 0 refills | Status: DC

## 2020-12-06 ENCOUNTER — Ambulatory Visit
Admission: RE | Admit: 2020-12-06 | Discharge: 2020-12-06 | Disposition: A | Discharge status: Home / Self Care | Payer: Self-pay | Source: Ambulatory Visit | Attending: Emergency Medicine | Admitting: Emergency Medicine

## 2020-12-06 ENCOUNTER — Other Ambulatory Visit: Payer: Self-pay

## 2020-12-06 VITALS — BP 117/71 | HR 80 | Temp 98.1°F | Resp 18

## 2020-12-06 DIAGNOSIS — A549 Gonococcal infection, unspecified: Secondary | ICD-10-CM | POA: Insufficient documentation

## 2020-12-06 DIAGNOSIS — Z09 Encounter for follow-up examination after completed treatment for conditions other than malignant neoplasm: Secondary | ICD-10-CM | POA: Insufficient documentation

## 2020-12-06 DIAGNOSIS — Z113 Encounter for screening for infections with a predominantly sexual mode of transmission: Secondary | ICD-10-CM | POA: Insufficient documentation

## 2020-12-06 DIAGNOSIS — A599 Trichomoniasis, unspecified: Secondary | ICD-10-CM | POA: Insufficient documentation

## 2020-12-06 NOTE — ED Triage Notes (Signed)
Pt was dx with gonorrhea and trichomonas on 6/27. He is requesting STD testing to be sure that the treatment was effective. Denies penile discharge, itching, and groin pain. Denies hematuria and dysuria. No known exposures.

## 2020-12-06 NOTE — ED Provider Notes (Signed)
[facility]    CSN: [id number] Arrival date & time: 12/06/20  1441      History   Chief Complaint Chief Complaint  Patient presents with   appointment @3p    std testing    HPI Christopher Larsen is a 20 y.o. male presenting today for STD screening.  Patient was recently seen on 6/27 and was positive for gonorrhea and trichomonas.  Was treated with Rocephin and Flagyl twice daily x1 week.  He was also positive for trichomonas in May 2022 and was also treated with weeklong Flagyl course.  Denies problems with taking medicine.  HPI  History reviewed. No pertinent past medical history.  Patient Active Problem List   Diagnosis Date Noted   DIZZINESS 08/13/2008   DYSARTHRIA 08/13/2008   HEADACHE 08/06/2008   DERMATITIS, ATOPIC 05/19/2007   RHINITIS, ALLERGIC 07/29/2006    Past Surgical History:  Procedure Laterality Date   HERNIA REPAIR         Home Medications    Prior to Admission medications   Medication Sig Start Date End Date Taking? Authorizing Provider  hydrocortisone 2.5 % lotion Apply topically 2 (two) times daily. 02/20/15   02/22/15, PA-C  ibuprofen (ADVIL,MOTRIN) 600 MG tablet Take 1 tablet (600 mg total) by mouth every 6 (six) hours as needed. 01/18/16   Sam, 01/20/16, PA-C  metroNIDAZOLE (FLAGYL) 500 MG tablet Take 1 tablet (500 mg total) by mouth 2 (two) times daily. 11/27/20   Lamptey, 11/29/20, MD  neomycin-bacitracin-polymyxin (NEOSPORIN) ointment Apply 1 application topically daily as needed for wound care. apply to eye    [provider]  SUMAtriptan (IMITREX) 100 MG tablet Take 1 tablet (100 mg total) by mouth every 2 (two) hours as needed for migraine. May repeat in 2 hours if headache persists or recurs. 05/06/18   14/6/19, MD    Family History Family History  Family history unknown: Yes    Social History Social History   Tobacco Use   Smoking status: Never   Smokeless tobacco: Never  Vaping Use   Vaping Use:  Never used  Substance Use Topics   Alcohol use: Yes   Drug use: No     Allergies   Patient has no known allergies.   Review of Systems Review of Systems  Constitutional:  Negative for fever.  HENT:  Negative for sore throat.   Respiratory:  Negative for shortness of breath.   Cardiovascular:  Negative for chest pain.  Gastrointestinal:  Negative for abdominal pain, nausea and vomiting.  Genitourinary:  Negative for difficulty urinating, dysuria, frequency, penile discharge, penile pain, penile swelling, scrotal swelling and testicular pain.  Skin:  Negative for rash.  Neurological:  Negative for dizziness, light-headedness and headaches.    Physical Exam Triage Vital Signs ED Triage Vitals  Enc Vitals Group     BP 12/06/20 1519 117/71     Pulse Rate 12/06/20 1519 80     Resp 12/06/20 1519 18     Temp 12/06/20 1519 98.1 F (36.7 C)     Temp Source 12/06/20 1519 Oral     SpO2 12/06/20 1519 98 %     Weight --      Height --      Head Circumference --      Peak Flow --      Pain Score 12/06/20 1521 0     Pain Loc --      Pain Edu? --      Excl. in  GC? --    No data found.  Updated Vital Signs BP 117/71 (BP Location: Left Arm)   Pulse 80   Temp 98.1 F (36.7 C) (Oral)   Resp 18   SpO2 98%   Visual Acuity Right Eye Distance:   Left Eye Distance:   Bilateral Distance:    Right Eye Near:   Left Eye Near:    Bilateral Near:     Physical Exam Vitals and nursing note reviewed.  Constitutional:      Appearance: He is well-developed.     Comments: No acute distress  HENT:     Head: Normocephalic and atraumatic.     Nose: Nose normal.  Eyes:     Conjunctiva/sclera: Conjunctivae normal.  Cardiovascular:     Rate and Rhythm: Normal rate.  Pulmonary:     Effort: Pulmonary effort is normal. No respiratory distress.  Abdominal:     General: There is no distension.  Musculoskeletal:        General: Normal range of motion.     Cervical back: Neck supple.   Skin:    General: Skin is warm and dry.  Neurological:     Mental Status: He is alert and oriented to person, place, and time.     UC Treatments / Results  Labs (all labs ordered are listed, but only abnormal results are displayed) Labs Reviewed  CYTOLOGY, (ORAL, ANAL, URETHRAL) ANCILLARY ONLY    EKG   Radiology No results found.  Procedures Procedures (including critical care time)  Medications Ordered in UC Medications - No data to display  Initial Impression / Assessment and Plan / UC Course  I have reviewed the triage vital signs and the nursing notes.  Pertinent labs & imaging results that were available during my care of the patient were reviewed by me and considered in my medical decision making (see chart for details).     Repeat urethral swab pending for evaluation of clearance of gonorrhea and trichomonas.  Discussed with patient informing partners of test results to avoid reinfection, if continuing to test positive for trichomonas may consider 2 g single dose versus trial of alternative antibiotic for possible resistance.  We will call with results and provide further treatment as needed.  Currently asymptomatic.  Discussed strict return precautions. Patient verbalized understanding and is agreeable with plan.  Final Clinical Impressions(s) / UC Diagnoses   Final diagnoses:  Screen for STD (sexually transmitted disease)  Trichomonas infection  Gonorrhea in male  Follow up     Discharge Instructions      We are testing you for Gonorrhea, Chlamydia and Trichomonas. We will call you if anything is positive and let you know if you require any further treatment. Please inform partner of any positive results.  Please return if symptoms not improving with treatment, development of fever, nausea, vomiting, abdominal pain, scrotal pain.     ED Prescriptions   None    PDMP not reviewed this encounter.   Lew Dawes, [place] 12/06/20 1545

## 2020-12-06 NOTE — Discharge Instructions (Addendum)
We are testing you for Gonorrhea, Chlamydia and Trichomonas. We will call you if anything is positive and let you know if you require any further treatment. Please inform partner of any positive results.  Please return if symptoms not improving with treatment, development of fever, nausea, vomiting, abdominal pain, scrotal pain. 

## 2020-12-09 LAB — CYTOLOGY, (ORAL, ANAL, URETHRAL) ANCILLARY ONLY
Chlamydia: POSITIVE — AB
Comment: NEGATIVE
Comment: NEGATIVE
Comment: NORMAL
Neisseria Gonorrhea: NEGATIVE
Trichomonas: POSITIVE — AB

## 2020-12-10 ENCOUNTER — Telehealth: Payer: Self-pay | Admitting: Emergency Medicine

## 2020-12-10 MED ORDER — METRONIDAZOLE 500 MG PO TABS: 2000.0000 mg | ORAL_TABLET | Freq: Once | ORAL | 0 refills | Status: AC

## 2020-12-10 MED ORDER — DOXYCYCLINE HYCLATE 100 MG PO CAPS: 100.0000 mg | ORAL_CAPSULE | Freq: Two times a day (BID) | ORAL | 0 refills | Status: AC

## 2020-12-10 NOTE — Telephone Encounter (Signed)
Follow-up from prior telephone note-accidentally signed before completed, swab positive for chlamydia and trichomonas.  Providing 2 g single dose for patient to take at once, has had positive trichomonas results in May and June of this year, encourage patient to follow-up in 2 weeks to screen for resolution, stressed importance of holding off on intercourse for 1 week as well as informing partners to prevent reinfection.  Doxycycline twice daily x1 week sent on prior telephone note to treat chlamydia.  Called patient and discussed results, verbalized understanding, answered questions.

## 2020-12-10 NOTE — Telephone Encounter (Signed)
Swab positive for trichomonas and chlamydia.  2 g Flagyl provided in clinic,

## 2021-02-10 ENCOUNTER — Ambulatory Visit (HOSPITAL_COMMUNITY): Payer: Self-pay

## 2021-02-11 ENCOUNTER — Telehealth: Payer: Self-pay | Admitting: Physician Assistant

## 2021-02-11 DIAGNOSIS — R369 Urethral discharge, unspecified: Secondary | ICD-10-CM

## 2021-02-11 NOTE — Patient Instructions (Signed)
  Joyce Copa, thank you for joining Piedad Climes, PA-C for today's virtual visit.  While this provider is not your primary care provider (PCP), if your PCP is located in our provider database this encounter information will be shared with them immediately following your visit.  Consent: (Patient) Christopher Larsen provided verbal consent for this virtual visit at the beginning of the encounter.  Current Medications:  Current Outpatient Medications:    ibuprofen (ADVIL,MOTRIN) 600 MG tablet, Take 1 tablet (600 mg total) by mouth every 6 (six) hours as needed., Disp: 30 tablet, Rfl: 0   Medications ordered in this encounter:  No orders of the defined types were placed in this encounter.    *If you need refills on other medications prior to your next appointment, please contact your pharmacy*  Follow-Up: Call back or seek an in-person evaluation if the symptoms worsen or if the condition fails to improve as anticipated.  Other Instructions Please use the link below to get scheduled at one of our urgent cares today for evaluation, testing and in-office treatment. Please avoid any sexual activity (including oral sex) until you are tested, treated and get a repeat test showing cure. Always limit sexual partners and use condoms consistently. You want to make sure any recent partners are made aware so they can be treated. Otherwise there is a risk of you getting from them again or potentially them spreading to another individual.   If you have been instructed to have an in-person evaluation today at a local Urgent Care facility, please use the link below. It will take you to a list of all of our available [facility], including address, phone number and hours of operation. Please do not delay care.  [facility]  If you or a family member do not have a primary care provider, use the link below to schedule a visit and establish care. When you choose a [facility]  primary care physician or advanced practice provider, you gain a long-term partner in health. Find a Primary Care Provider  Learn more about [facility]'s in-office and virtual care options:  - Get Care Now

## 2021-02-11 NOTE — Progress Notes (Signed)
Virtual Visit Consent   Christopher Larsen, you are scheduled for a virtual visit with a [facility] provider today.     Just as with appointments in the office, your consent must be obtained to participate.  Your consent will be active for this visit and any virtual visit you may have with one of our providers in the next 365 days.     If you have a MyChart account, a copy of this consent can be sent to you electronically.  All virtual visits are billed to your insurance company just like a traditional visit in the office.    As this is a virtual visit, video technology does not allow for your provider to perform a traditional examination.  This may limit your provider's ability to fully assess your condition.  If your provider identifies any concerns that need to be evaluated in person or the need to arrange testing (such as labs, EKG, etc.), we will make arrangements to do so.     Although advances in technology are sophisticated, we cannot ensure that it will always work on either your end or our end.  If the connection with a video visit is poor, the visit may have to be switched to a telephone visit.  With either a video or telephone visit, we are not always able to ensure that we have a secure connection.     I need to obtain your verbal consent now.   Are you willing to proceed with your visit today?    Christopher Larsen has provided verbal consent on 02/11/2021 for a virtual visit (video or telephone).   Piedad Climes, [place]   Date: 02/11/2021 9:01 AM   Virtual Visit via Video Note   I, Piedad Climes, connected with  Christopher Larsen  ([id number], 05-05-2001) on 02/11/21 at  8:30 AM EDT by a video-enabled telemedicine application and verified that I am speaking with the correct person using two identifiers.  Location: Patient: Virtual Visit Location Patient: Home Provider: Virtual Visit Location Provider: Home Office   I discussed the limitations of evaluation and management  by telemedicine and the availability of in person appointments. The patient expressed understanding and agreed to proceed.    History of Present Illness: Christopher Larsen is a 20 y.o. who identifies as a male who was assigned male at birth, and is being seen today for possible medication. He notes a few days of green discharge from his penis without any significant discomfort or pain. Thinks he may have gonorrhea but is not sure. Has history of multiple STI in the past, most recently treated for several months for gonorrhea, chlamydia and trich with no subsequent test of cure.   HPI: HPI  Problems:  Patient Active Problem List   Diagnosis Date Noted   DIZZINESS 08/13/2008   DYSARTHRIA 08/13/2008   HEADACHE 08/06/2008   DERMATITIS, ATOPIC 05/19/2007   RHINITIS, ALLERGIC 07/29/2006    Allergies: No Known Allergies Medications:  Current Outpatient Medications:    ibuprofen (ADVIL,MOTRIN) 600 MG tablet, Take 1 tablet (600 mg total) by mouth every 6 (six) hours as needed., Disp: 30 tablet, Rfl: 0  Observations/Objective: Patient is well-developed, well-nourished in no acute distress.  Resting comfortably at home.  Head is normocephalic, atraumatic.  No labored breathing. Speech is clear and coherent with logical content.  Patient is alert and oriented at baseline.   Assessment and Plan: 1. Penile discharge Discussed he needs to be seen in person for proper diagnosis and observed treatment,  especially giving multiple different STI in the past several months. Needs full panel to include HIV and Syphilis testing. He has been triaged to Urgent Care and is to refrain from sexual activity until tested, treated and had a test of cure. He voiced understanding.   Follow Up Instructions: I discussed the assessment and treatment plan with the patient. The patient was provided an opportunity to ask questions and all were answered. The patient agreed with the plan and demonstrated an understanding of  the instructions.  A copy of instructions were sent to the patient via MyChart.  The patient was advised to call back or seek an in-person evaluation if the symptoms worsen or if the condition fails to improve as anticipated.  Time:  I spent 10 minutes with the patient via telehealth technology discussing the above problems/concerns.    Piedad Climes, PA-C

## 2021-02-14 ENCOUNTER — Ambulatory Visit (HOSPITAL_COMMUNITY)
Admission: EM | Admit: 2021-02-14 | Discharge: 2021-02-14 | Disposition: A | Discharge status: Home / Self Care | Payer: Self-pay | Attending: Emergency Medicine | Admitting: Emergency Medicine

## 2021-02-14 ENCOUNTER — Other Ambulatory Visit: Payer: Self-pay

## 2021-02-14 ENCOUNTER — Encounter (HOSPITAL_COMMUNITY): Payer: Self-pay

## 2021-02-14 DIAGNOSIS — R360 Urethral discharge without blood: Secondary | ICD-10-CM | POA: Insufficient documentation

## 2021-02-14 DIAGNOSIS — Z8619 Personal history of other infectious and parasitic diseases: Secondary | ICD-10-CM | POA: Insufficient documentation

## 2021-02-14 MED ORDER — CEFTRIAXONE SODIUM 500 MG IJ SOLR
500.0000 mg | Freq: Once | INTRAMUSCULAR | Status: AC
  Administered 2021-02-14: 500 mg via INTRAMUSCULAR

## 2021-02-14 MED ORDER — CEFTRIAXONE SODIUM 500 MG IJ SOLR
INTRAMUSCULAR | Status: AC
  Filled 2021-02-14: qty 500

## 2021-02-14 MED ORDER — LIDOCAINE HCL (PF) 1 % IJ SOLN
INTRAMUSCULAR | Status: AC
  Filled 2021-02-14: qty 2

## 2021-02-14 MED ORDER — METRONIDAZOLE 500 MG PO TABS: 2000.0000 mg | ORAL_TABLET | ORAL | 0 refills | Status: AC

## 2021-02-14 NOTE — ED Provider Notes (Signed)
[facility]    CSN: [id number] Arrival date & time: 02/14/21  0915      History   Chief Complaint Chief Complaint  Patient presents with   Penile Discharge    HPI Christopher Larsen is a 20 y.o. male.   Patient states he has a 3-day history of a thick yellow discharge from his penis.  Patient reports being sexually active, currently not using condoms regularly.  States he tested positive for trichomonas and gonorrhea 2 to 3 months ago, states symptoms today are similar.  Patient denies inguinal pain/swelling, scrotal pain/swelling, burning with urination, pelvic pain, unusual urine odor.  The history is provided by the patient.   History reviewed. No pertinent past medical history.  Patient Active Problem List   Diagnosis Date Noted   DIZZINESS 08/13/2008   DYSARTHRIA 08/13/2008   HEADACHE 08/06/2008   DERMATITIS, ATOPIC 05/19/2007   RHINITIS, ALLERGIC 07/29/2006    Past Surgical History:  Procedure Laterality Date   HERNIA REPAIR         Home Medications    Prior to Admission medications   Medication Sig Start Date End Date Taking? Authorizing Provider  metroNIDAZOLE (FLAGYL) 500 MG tablet Take 4 tablets (2,000 mg total) by mouth 1 day or 1 dose for 1 dose. 02/14/21 02/15/21 Yes Theadora Rama Scales, PA-C  ibuprofen (ADVIL,MOTRIN) 600 MG tablet Take 1 tablet (600 mg total) by mouth every 6 (six) hours as needed. 01/18/16   Eliseo Squires, PA-C    Family History Family History  Family history unknown: Yes    Social History Social History   Tobacco Use   Smoking status: Never   Smokeless tobacco: Never  Vaping Use   Vaping Use: Never used  Substance Use Topics   Alcohol use: Yes   Drug use: No     Allergies   Patient has no known allergies.   Review of Systems Review of Systems Per HPI  Physical Exam Triage Vital Signs ED Triage Vitals  Enc Vitals Group     BP 02/14/21 1013 117/70     Pulse Rate 02/14/21 1013 64     Resp  02/14/21 1013 18     Temp 02/14/21 1013 98.3 F (36.8 C)     Temp Source 02/14/21 1013 Oral     SpO2 02/14/21 1013 97 %     Weight --      Height --      Head Circumference --      Peak Flow --      Pain Score 02/14/21 1016 2     Pain Loc --      Pain Edu? --      Excl. in GC? --    No data found.  Updated Vital Signs BP 117/70 (BP Location: Left Arm)   Pulse 64   Temp 98.3 F (36.8 C) (Oral)   Resp 18   SpO2 97%   Visual Acuity Right Eye Distance:   Left Eye Distance:   Bilateral Distance:    Right Eye Near:   Left Eye Near:    Bilateral Near:     Physical Exam Constitutional:      Appearance: Normal appearance.  HENT:     Head: Normocephalic and atraumatic.  Cardiovascular:     Rate and Rhythm: Normal rate and regular rhythm.     Heart sounds: Normal heart sounds.  Pulmonary:     Effort: Pulmonary effort is normal.     Breath sounds: Normal breath sounds.  Abdominal:     General: Abdomen is flat. Bowel sounds are normal.     Palpations: Abdomen is soft.  Genitourinary:    Comments: Patient politely declines GU exam today, patient did provide [place] with a urethral swab for cytology. Musculoskeletal:        General: Normal range of motion.  Skin:    General: Skin is warm and dry.  Neurological:     General: No focal deficit present.     Mental Status: He is alert and oriented to person, place, and time.  Psychiatric:        Mood and Affect: Mood normal.        Behavior: Behavior normal.     UC Treatments / Results  Labs (all labs ordered are listed, but only abnormal results are displayed) Labs Reviewed  CYTOLOGY, (ORAL, ANAL, URETHRAL) ANCILLARY ONLY    EKG   Radiology No results found.  Procedures Procedures (including critical care time)  Medications Ordered in UC Medications  cefTRIAXone (ROCEPHIN) injection 500 mg (has no administration in time range)    Initial Impression / Assessment and Plan / UC Course  I have reviewed the  triage vital signs and the nursing notes.  Pertinent labs & imaging results that were available during my care of the patient were reviewed by me and considered in my medical decision making (see chart for details).     Given patient's recent history of STD, gonorrhea and trichomoniasis, will treat patient empirically for both.  Patient advised that urethral swab culture results will be provided to him once they are available and that if treatment is needed for chlamydia we will provide that as well.  Patient verbalized agreement and understanding with the plan as outlined, all questions were addressed. Final Clinical Impressions(s) / UC Diagnoses   Final diagnoses:  Abnormal penile discharge, without blood  History of gonorrhea  History of trichomoniasis     Discharge Instructions      You were treated for gonorrhea and trichomonas empirically based on the history that you provided today.  Once the results of your urethral swab are received we will notify you.  If you need further treatment we will provide that for you as well.  Please abstain from sexual contact for the next 7 days.  As always, it is recommended that she use condoms with sexual intercourse to avoid contracting sexually transmitted disease.  Repeated infections can lead to problems down the road such as obstructed urine outflow and difficulty with conception.     ED Prescriptions     Medication Sig Dispense Auth. Provider   metroNIDAZOLE (FLAGYL) 500 MG tablet Take 4 tablets (2,000 mg total) by mouth 1 day or 1 dose for 1 dose. 4 tablet Theadora Rama Scales, PA-C      PDMP not reviewed this encounter.   Theadora Rama Scales, PA-C 02/14/21 1100

## 2021-02-14 NOTE — ED Triage Notes (Signed)
Pt presents with penile discharge X 2 days ago.

## 2021-02-14 NOTE — Discharge Instructions (Addendum)
You were treated for gonorrhea and trichomonas empirically based on the history that you provided today.  Once the results of your urethral swab are received we will notify you.  If you need further treatment we will provide that for you as well.  Please abstain from sexual contact for the next 7 days.  As always, it is recommended that she use condoms with sexual intercourse to avoid contracting sexually transmitted disease.  Repeated infections can lead to problems down the road such as obstructed urine outflow and difficulty with conception.

## 2021-02-17 LAB — CYTOLOGY, (ORAL, ANAL, URETHRAL) ANCILLARY ONLY
Chlamydia: NEGATIVE
Comment: NEGATIVE
Comment: NEGATIVE
Comment: NORMAL
Neisseria Gonorrhea: NEGATIVE
Trichomonas: NEGATIVE

## 2021-02-24 ENCOUNTER — Other Ambulatory Visit: Payer: Self-pay

## 2021-02-24 ENCOUNTER — Ambulatory Visit
Admission: EM | Admit: 2021-02-24 | Discharge: 2021-02-24 | Disposition: A | Discharge status: Home / Self Care | Payer: Self-pay | Attending: Urgent Care | Admitting: Urgent Care

## 2021-02-24 ENCOUNTER — Encounter: Payer: Self-pay | Admitting: Emergency Medicine

## 2021-02-24 DIAGNOSIS — G43809 Other migraine, not intractable, without status migrainosus: Secondary | ICD-10-CM

## 2021-02-24 MED ORDER — SUMATRIPTAN SUCCINATE 25 MG PO TABS: ORAL_TABLET | ORAL | 0 refills | Status: AC

## 2021-02-24 MED ORDER — ONDANSETRON 8 MG PO TBDP: 8.0000 mg | ORAL_TABLET | Freq: Three times a day (TID) | ORAL | 0 refills | Status: AC | PRN

## 2021-02-24 MED ORDER — KETOROLAC TROMETHAMINE 60 MG/2ML IM SOLN
60.0000 mg | Freq: Once | INTRAMUSCULAR | Status: AC
Start: 2021-02-24 — End: 2021-02-24
  Administered 2021-02-24: 60 mg via INTRAMUSCULAR

## 2021-02-24 NOTE — ED Triage Notes (Signed)
Right sided migraine x 4/5 days with photophobia and occasional dizziness. Tried tylenol and zyrtec with mild improvement.

## 2021-02-24 NOTE — ED Provider Notes (Signed)
[facility]   MRN: [id number] DOB: [birth year]  Subjective:   Christopher Larsen is a 20 y.o. male presenting for 4-day history of persistent right-sided frontal temporal headache with photophobia, intermittent nausea and vomiting.  Patient has a history of headaches and is typically done well with sumatriptan but does not have this.  He also has a history of allergic rhinitis but is only taking Zyrtec at the moment.  He has had some Tylenol with some relief of his headache.  Has fluctuated between moderate and severe but is more moderate when he is taking medicines for it.  Denies confusion, vision change, weakness, numbness or tingling, cough, shortness of breath, chest pain, abdominal pain.  Currently patient rates his headache a 5 out of 10.  No current facility-administered medications for this encounter.  Current Outpatient Medications:    ibuprofen (ADVIL,MOTRIN) 600 MG tablet, Take 1 tablet (600 mg total) by mouth every 6 (six) hours as needed., Disp: 30 tablet, Rfl: 0   No Known Allergies  History reviewed. No pertinent past medical history.   Past Surgical History:  Procedure Laterality Date   HERNIA REPAIR      Family History  Family history unknown: Yes    Social History   Tobacco Use   Smoking status: Never   Smokeless tobacco: Never  Vaping Use   Vaping Use: Never used  Substance Use Topics   Alcohol use: Yes   Drug use: No    ROS   Objective:   Vitals: BP 129/84 (BP Location: Left Arm)   Pulse 75   Temp (!) 97.4 F (36.3 C) (Oral)   Resp 20   SpO2 97%   Physical Exam Constitutional:      General: He is not in acute distress.    Appearance: Normal appearance. He is well-developed. He is not ill-appearing, toxic-appearing or diaphoretic.  HENT:     Head: Normocephalic and atraumatic.     Right Ear: External ear normal.     Left Ear: External ear normal.     Nose: Nose normal.     Mouth/Throat:     Mouth: Mucous membranes are moist.      Pharynx: Oropharynx is clear.  Eyes:     General: No scleral icterus.    Extraocular Movements: Extraocular movements intact.     Pupils: Pupils are equal, round, and reactive to light.  Cardiovascular:     Rate and Rhythm: Normal rate and regular rhythm.     Heart sounds: Normal heart sounds. No murmur heard.   No friction rub. No gallop.  Pulmonary:     Effort: Pulmonary effort is normal. No respiratory distress.     Breath sounds: Normal breath sounds. No stridor. No wheezing, rhonchi or rales.  Neurological:     Mental Status: He is alert and oriented to person, place, and time.     Cranial Nerves: No cranial nerve deficit, dysarthria or facial asymmetry.     Motor: No weakness, tremor, atrophy, abnormal muscle tone, seizure activity or pronator drift.     Coordination: Romberg sign negative. Coordination normal. Finger-Nose-Finger Test and Heel to [facility] Test normal. Rapid alternating movements normal.  Psychiatric:        Mood and Affect: Mood normal.        Behavior: Behavior normal.        Thought Content: Thought content normal.    Assessment and Plan :   PDMP not reviewed this encounter.  1. Other migraine without status migrainosus, not  intractable     IM Toradol given in clinic for his migraine.  Recommended sumatriptan, Zofran for his migraines as an outpatient.  Follow-up with the headache clinic.  No signs of an acute encephalopathy and therefore will defer the emergency room visit.  Counseled patient on potential for adverse effects with medications prescribed/recommended today, ER and return-to-clinic precautions discussed, patient verbalized understanding.    Wallis Bamberg, PA-C 02/24/21 1245

## 2021-03-02 ENCOUNTER — Emergency Department (HOSPITAL_COMMUNITY)
Admission: EM | Admit: 2021-03-02 | Discharge: 2021-03-02 | Disposition: A | Discharge status: Home / Self Care | Payer: Self-pay | Attending: Emergency Medicine | Admitting: Emergency Medicine

## 2021-03-02 DIAGNOSIS — Z20822 Contact with and (suspected) exposure to covid-19: Secondary | ICD-10-CM | POA: Insufficient documentation

## 2021-03-02 DIAGNOSIS — Z5321 Procedure and treatment not carried out due to patient leaving prior to being seen by health care provider: Secondary | ICD-10-CM | POA: Insufficient documentation

## 2021-03-02 DIAGNOSIS — G43909 Migraine, unspecified, not intractable, without status migrainosus: Secondary | ICD-10-CM | POA: Insufficient documentation

## 2021-03-02 LAB — RESP PANEL BY RT-PCR (FLU A&B, COVID) ARPGX2
Influenza A by PCR: NEGATIVE
Influenza B by PCR: NEGATIVE
SARS Coronavirus 2 by RT PCR: NEGATIVE

## 2021-03-02 NOTE — ED Provider Notes (Signed)
Emergency Medicine Provider Triage Evaluation Note  Christopher Larsen , a 20 y.o. male  was evaluated in triage.  Pt complains of intermittent headache for the past 3 weeks.  Patient reports it is off and on for 2 days at a time.  He reports right ear pain and frontal maxillary sinus pressure with the headaches.  Denies any blurry vision, dizziness, lightheadedness.  The patient reports he had 1 episode of vomiting last week but does not member when.  He denies any fevers, chest pain, shortness of breath.  Denies any cough or sore throat.  Ibuprofen taken last night with no relief. Review of Systems  Positive: Headache, right ear pain, frontal and maxillary sinus tenderness, one episode of emesis Negative: Fever, chest pain, shortness of breath, nausea, sore throat  Physical Exam  BP 106/69 (BP Location: Left Arm)   Pulse 77   Temp 98.7 F (37.1 C) (Oral)   Resp 16   SpO2 100%  Gen:   Awake, no distress   Resp:  Normal effort  MSK:   Moves extremities without difficulty  Other:    Medical Decision Making  Medically screening exam initiated at 6:32 PM.  Appropriate orders placed.  Christopher Larsen was informed that the remainder of the evaluation will be completed by another provider, this initial triage assessment does not replace that evaluation, and the importance of remaining in the ED until their evaluation is complete.  Patient is stable for further evaluation.    Achille Rich, PA-C 03/02/21 1835    Derwood Kaplan, MD 03/03/21 [phone]

## 2021-03-02 NOTE — ED Notes (Signed)
X1 for vitals recheck with no response 

## 2021-03-02 NOTE — ED Triage Notes (Signed)
Pt c/o R sided migraine HA x "3 weeks," endorses photophobia & dizziness on occasion, says he "threw up last week." States hx of migraine HA, "but never this bad."  No medication today

## 2021-03-02 NOTE — ED Notes (Signed)
X3 no response 

## 2021-03-02 NOTE — ED Notes (Signed)
X2 no response °

## 2021-03-04 ENCOUNTER — Encounter (HOSPITAL_COMMUNITY): Payer: Self-pay

## 2021-03-04 ENCOUNTER — Emergency Department (HOSPITAL_COMMUNITY)
Admission: EM | Admit: 2021-03-04 | Discharge: 2021-03-04 | Disposition: A | Discharge status: Home / Self Care | Payer: Self-pay | Attending: Emergency Medicine | Admitting: Emergency Medicine

## 2021-03-04 DIAGNOSIS — Z5321 Procedure and treatment not carried out due to patient leaving prior to being seen by health care provider: Secondary | ICD-10-CM | POA: Insufficient documentation

## 2021-03-04 DIAGNOSIS — G43909 Migraine, unspecified, not intractable, without status migrainosus: Secondary | ICD-10-CM | POA: Insufficient documentation

## 2021-03-04 NOTE — ED Provider Notes (Signed)
Emergency Medicine Provider Triage Evaluation Note  Christopher Larsen , a 20 y.o. male  was evaluated in triage.  Pt complains of ongoing right-sided headache for 3 weeks.  He has a history of the same and had a reassuring CAT scan at that time.  He states that he has vomited twice over the past 3 weeks with headache.  He denies any trauma.  No fevers.  He states that sometimes when the pain is bad his vision does get a little bit blurry.  He has tried some sinus medications without significant relief of his symptoms.  He does not have a primary care..  Review of Systems  Positive: Headache, occasional blurry vision, not currently Negative: Fever, syncope.   Physical Exam  BP 124/78 (BP Location: Right Arm)   Pulse 73   Temp 98.2 F (36.8 C) (Oral)   Resp 18   SpO2 100%  Gen:   Awake, no distress   Resp:  Normal effort  MSK:   Moves extremities without difficulty  Other:  Normal speech and gait.   Medical Decision Making  Medically screening exam initiated at 2:31 PM.  Appropriate orders placed.  Christopher Larsen was informed that the remainder of the evaluation will be completed by another provider, this initial triage assessment does not replace that evaluation, and the importance of remaining in the ED until their evaluation is complete.  CT head offered, patient declined, stating he wants migraine cocktail.   Note: Portions of this report may have been transcribed using voice recognition software. Every effort was made to ensure accuracy; however, inadvertent computerized transcription errors may be present    Cristina Gong, PA-C 03/04/21 1432    Derwood Kaplan, MD 03/14/21 [phone]

## 2021-03-04 NOTE — ED Triage Notes (Signed)
Pt presents with c/o migraine for 3 weeks, hx of same.

## 2021-03-12 ENCOUNTER — Other Ambulatory Visit: Payer: Self-pay

## 2021-03-12 ENCOUNTER — Ambulatory Visit
Admission: EM | Admit: 2021-03-12 | Discharge: 2021-03-12 | Disposition: A | Discharge status: Home / Self Care | Payer: Self-pay | Attending: Internal Medicine | Admitting: Internal Medicine

## 2021-03-12 ENCOUNTER — Encounter: Payer: Self-pay | Admitting: Neurology

## 2021-03-12 DIAGNOSIS — J039 Acute tonsillitis, unspecified: Secondary | ICD-10-CM | POA: Insufficient documentation

## 2021-03-12 DIAGNOSIS — J0110 Acute frontal sinusitis, unspecified: Secondary | ICD-10-CM | POA: Insufficient documentation

## 2021-03-12 DIAGNOSIS — R519 Headache, unspecified: Secondary | ICD-10-CM | POA: Insufficient documentation

## 2021-03-12 LAB — POCT RAPID STREP A (OFFICE): Rapid Strep A Screen: NEGATIVE

## 2021-03-12 MED ORDER — AMOXICILLIN 875 MG PO TABS: 875.0000 mg | ORAL_TABLET | Freq: Two times a day (BID) | ORAL | 0 refills | Status: AC

## 2021-03-12 NOTE — ED Provider Notes (Signed)
[facility]    CSN: [id number] Arrival date & time: 03/12/21  0856      History   Chief Complaint Chief Complaint  Patient presents with   Headache    HPI Christopher Larsen is a 20 y.o. male.   Patient presents with right-sided headaches that have been present for approximately 5 weeks.  Patient reports that headaches occur almost daily and occur "behind the right eye".  Pain is described as "throbbing" and is described as 9/10 on pain scale when they occur.  Denies current headache.  Patient does have history of migraines that have been present since 2016.  Has never seen neurologist.  Was seen on 02/24/2021 at urgent care and was prescribed sumatriptan as sumatriptan has previously been successful in treating patient's migraines.  Patient reports that he is taking sumatriptan, ibuprofen, Excedrin, and "sinus medication" that have not provided any relief of migraines.  Last known migraine was last night.  Patient does endorse nausea, blurred vision, dizziness when these migraines occur.  Patient also reports that he has a stuffy nose only when the migraines occur.  Denies any fevers.  Denies any known sick contacts.  Patient also reports that he was seen at [facility] emergency department about a week ago and was told that a CT scan cannot be performed "due to risk of exposure of radiation".   Headache  History reviewed. No pertinent past medical history.  Patient Active Problem List   Diagnosis Date Noted   DIZZINESS 08/13/2008   DYSARTHRIA 08/13/2008   HEADACHE 08/06/2008   DERMATITIS, ATOPIC 05/19/2007   RHINITIS, ALLERGIC 07/29/2006    Past Surgical History:  Procedure Laterality Date   HERNIA REPAIR         Home Medications    Prior to Admission medications   Medication Sig Start Date End Date Taking? Authorizing Provider  amoxicillin (AMOXIL) 875 MG tablet Take 1 tablet (875 mg total) by mouth 2 (two) times daily for 7 days. 03/12/21 03/19/21 Yes  Lance Muss, FNP  ibuprofen (ADVIL,MOTRIN) 600 MG tablet Take 1 tablet (600 mg total) by mouth every 6 (six) hours as needed. 01/18/16   Eliseo Squires, PA-C  ondansetron (ZOFRAN-ODT) 8 MG disintegrating tablet Take 1 tablet (8 mg total) by mouth every 8 (eight) hours as needed for nausea or vomiting. 02/24/21   Wallis Bamberg, PA-C  SUMAtriptan (IMITREX) 25 MG tablet Take 1 tablet at the start of a migraine. If the pain persists after 2 hours take 1 more tablet. Do not exceed 2 doses in 24 hours. 02/24/21   Wallis Bamberg, PA-C    Family History Family History  Family history unknown: Yes    Social History Social History   Tobacco Use   Smoking status: Never   Smokeless tobacco: Never  Vaping Use   Vaping Use: Never used  Substance Use Topics   Alcohol use: Yes   Drug use: No     Allergies   Patient has no known allergies.   Review of Systems Review of Systems Per HPI  Physical Exam Triage Vital Signs ED Triage Vitals [03/12/21 0952]  Enc Vitals Group     BP 119/85     Pulse Rate 83     Resp 18     Temp 97.9 F (36.6 C)     Temp Source Oral     SpO2 96 %     Weight      Height      Head Circumference  Peak Flow      Pain Score 0     Pain Loc      Pain Edu?      Excl. in GC?    No data found.  Updated Vital Signs BP 119/85 (BP Location: Left Arm)   Pulse 83   Temp 97.9 F (36.6 C) (Oral)   Resp 18   SpO2 96%   Visual Acuity Right Eye Distance:   Left Eye Distance:   Bilateral Distance:    Right Eye Near:   Left Eye Near:    Bilateral Near:     Physical Exam Constitutional:      General: He is not in acute distress.    Appearance: Normal appearance. He is not toxic-appearing or diaphoretic.  HENT:     Head: Normocephalic and atraumatic.     Right Ear: Tympanic membrane and ear canal normal.     Left Ear: Tympanic membrane and ear canal normal.     Nose: Congestion present.     Right Sinus: Frontal sinus tenderness present.     Left  Sinus: Frontal sinus tenderness present.     Mouth/Throat:     Mouth: Mucous membranes are moist.     Pharynx: Posterior oropharyngeal erythema present.     Tonsils: 1+ on the right. 1+ on the left.  Eyes:     Extraocular Movements: Extraocular movements intact.     Conjunctiva/sclera: Conjunctivae normal.     Pupils: Pupils are equal, round, and reactive to light.  Cardiovascular:     Rate and Rhythm: Normal rate and regular rhythm.     Pulses: Normal pulses.     Heart sounds: Normal heart sounds.  Pulmonary:     Effort: Pulmonary effort is normal. No respiratory distress.     Breath sounds: Normal breath sounds. No wheezing.  Abdominal:     General: Abdomen is flat. Bowel sounds are normal.     Palpations: Abdomen is soft.  Musculoskeletal:        General: Normal range of motion.     Cervical back: Normal range of motion.  Lymphadenopathy:     Cervical: No cervical adenopathy.  Skin:    General: Skin is warm.  Neurological:     General: No focal deficit present.     Mental Status: He is alert and oriented to person, place, and time. Mental status is at baseline.     Cranial Nerves: Cranial nerves are intact.     Sensory: Sensation is intact.     Motor: Motor function is intact.     Coordination: Coordination is intact.     Gait: Gait is intact.  Psychiatric:        Mood and Affect: Mood normal.        Behavior: Behavior normal.     UC Treatments / Results  Labs (all labs ordered are listed, but only abnormal results are displayed) Labs Reviewed  CULTURE, GROUP A STREP [facility])  POCT RAPID STREP A (OFFICE)    EKG   Radiology No results found.  Procedures Procedures (including critical care time)  Medications Ordered in UC Medications - No data to display  Initial Impression / Assessment and Plan / UC Course  I have reviewed the triage vital signs and the nursing notes.  Pertinent labs & imaging results that were available during my care of the patient  were reviewed by me and considered in my medical decision making (see chart for details).     Patient was  advised that he should go to the hospital for further evaluation and management as migraines have been present for 5 weeks and are refractory to pain relief medications.  Patient declined going to the hospital.  Risks associated with not going to the hospital were discussed with patient.  Patient voiced understanding.  It is possible that upper respiratory infection could be causing patient's symptoms as patient has congestion, sinus tenderness, and acute tonsillitis on exam.  Will attempt treatment with amoxicillin to see if symptoms resolve with this current treatment plan.  Although, patient was advised to go to the hospital if symptoms significantly worsen or change.  Patient was also provided with contact information for neurology and headache clinic to follow-up today for further evaluation and management of chronic migraines.  Neuro exam was normal and no signs of acute encephalopathy.  So do think that this plan is reasonable.Discussed strict return precautions. Patient verbalized understanding and is agreeable with plan.  Final Clinical Impressions(s) / UC Diagnoses   Final diagnoses:  Acute tonsillitis, unspecified etiology  Acute non-recurrent frontal sinusitis  Acute nonintractable headache, unspecified headache type     Discharge Instructions      Your rapid strep test was negative.  Throat culture is pending.  We will call if it is positive.  You are being treated with amoxicillin antibiotic to treat possible sinus infection.  Please go to the hospital if symptoms significantly worsen or persist with antibiotic treatment.  Also follow-up with provided contact information for neurology for further management of chronic migraines.     ED Prescriptions     Medication Sig Dispense Auth. Provider   amoxicillin (AMOXIL) 875 MG tablet Take 1 tablet (875 mg total) by mouth 2  (two) times daily for 7 days. 14 tablet Lance Muss, FNP      PDMP not reviewed this encounter.   Lance Muss, FNP 03/12/21 1023

## 2021-03-12 NOTE — ED Triage Notes (Signed)
Pt c/o headaches to rt side of face and behind rt eye x5 weeks. States has been tx'd for migraine headaches with no relief. States he feels like he has a sinus infection.

## 2021-03-12 NOTE — Discharge Instructions (Addendum)
Your rapid strep test was negative.  Throat culture is pending.  We will call if it is positive.  You are being treated with amoxicillin antibiotic to treat possible sinus infection.  Please go to the hospital if symptoms significantly worsen or persist with antibiotic treatment.  Also follow-up with provided contact information for neurology for further management of chronic migraines.

## 2021-03-15 LAB — CULTURE, GROUP A STREP (THRC)

## 2021-03-28 ENCOUNTER — Encounter: Payer: Self-pay | Admitting: Emergency Medicine

## 2021-03-28 ENCOUNTER — Other Ambulatory Visit: Payer: Self-pay

## 2021-03-28 ENCOUNTER — Ambulatory Visit: Admission: EM | Admit: 2021-03-28 | Discharge: 2021-03-28 | Discharge status: Against Medical Advice | Payer: Self-pay

## 2021-03-28 NOTE — ED Triage Notes (Signed)
Reports recent unprotected sex and has noticed a small amount of yellow discharge from penis, denies testicular pain. Requesting swab, does not want bloodwork

## 2021-03-28 NOTE — ED Notes (Signed)
Patient went to the bathroom, swabbed himself, then proceeded to walk out of the door before being seen by the provider. Attempted to call patient without answer.

## 2021-05-05 ENCOUNTER — Ambulatory Visit
Admission: RE | Admit: 2021-05-05 | Discharge: 2021-05-05 | Disposition: A | Discharge status: Home / Self Care | Payer: Self-pay | Source: Ambulatory Visit | Attending: Physician Assistant | Admitting: Physician Assistant

## 2021-05-05 ENCOUNTER — Other Ambulatory Visit: Payer: Self-pay

## 2021-05-05 VITALS — BP 101/67 | HR 88 | Temp 97.4°F | Resp 18 | Ht 75.0 in | Wt 180.0 lb

## 2021-05-05 DIAGNOSIS — Z113 Encounter for screening for infections with a predominantly sexual mode of transmission: Secondary | ICD-10-CM | POA: Insufficient documentation

## 2021-05-05 NOTE — ED Provider Notes (Signed)
[facility]    CSN: [id number] Arrival date & time: 05/05/21  8676      History   Chief Complaint Chief Complaint  Patient presents with   Exposure to STD    HPI Refujio Haymer is a 20 y.o. male.   Patient here today for evaluation of penile discharge that started after unprotected sex 3 days ago. He denies any dysuria. He has not had any genital lesions.   The history is provided by the patient.  Exposure to STD Pertinent negatives include no abdominal pain.   History reviewed. No pertinent past medical history.  Patient Active Problem List   Diagnosis Date Noted   DIZZINESS 08/13/2008   DYSARTHRIA 08/13/2008   HEADACHE 08/06/2008   DERMATITIS, ATOPIC 05/19/2007   RHINITIS, ALLERGIC 07/29/2006    Past Surgical History:  Procedure Laterality Date   HERNIA REPAIR         Home Medications    Prior to Admission medications   Medication Sig Start Date End Date Taking? Authorizing Provider  ibuprofen (ADVIL,MOTRIN) 600 MG tablet Take 1 tablet (600 mg total) by mouth every 6 (six) hours as needed. 01/18/16  Yes London Sheer Y, PA-C  ondansetron (ZOFRAN-ODT) 8 MG disintegrating tablet Take 1 tablet (8 mg total) by mouth every 8 (eight) hours as needed for nausea or vomiting. 02/24/21  Yes Wallis Bamberg, PA-C  SUMAtriptan (IMITREX) 25 MG tablet Take 1 tablet at the start of a migraine. If the pain persists after 2 hours take 1 more tablet. Do not exceed 2 doses in 24 hours. 02/24/21   Wallis Bamberg, PA-C    Family History Family History  Family history unknown: Yes    Social History Social History   Tobacco Use   Smoking status: Never   Smokeless tobacco: Never  Vaping Use   Vaping Use: Never used  Substance Use Topics   Alcohol use: Yes   Drug use: No     Allergies   Patient has no known allergies.   Review of Systems Review of Systems  Constitutional:  Negative for chills and fever.  Eyes:  Negative for discharge and redness.   Gastrointestinal:  Negative for abdominal pain, nausea and vomiting.  Genitourinary:  Positive for penile discharge. Negative for dysuria and genital sores.  Musculoskeletal:  Negative for back pain.    Physical Exam Triage Vital Signs ED Triage Vitals  Enc Vitals Group     BP 05/05/21 0957 101/67     Pulse Rate 05/05/21 0957 88     Resp 05/05/21 0957 18     Temp 05/05/21 0957 (!) 97.4 F (36.3 C)     Temp Source 05/05/21 0957 Oral     SpO2 05/05/21 0957 95 %     Weight 05/05/21 0955 180 lb (81.6 kg)     Height 05/05/21 0955 6\' 3"  (1.905 m)     Head Circumference --      Peak Flow --      Pain Score 05/05/21 0955 0     Pain Loc --      Pain Edu? --      Excl. in GC? --    No data found.  Updated Vital Signs BP 101/67 (BP Location: Left Arm)   Pulse 88   Temp (!) 97.4 F (36.3 C) (Oral)   Resp 18   Ht 6\' 3"  (1.905 m)   Wt 180 lb (81.6 kg)   SpO2 95%   BMI 22.50 kg/m  Physical Exam Vitals and nursing note reviewed.  Constitutional:      General: He is not in acute distress.    Appearance: Normal appearance. He is not ill-appearing.  HENT:     Head: Normocephalic and atraumatic.  Eyes:     Conjunctiva/sclera: Conjunctivae normal.  Cardiovascular:     Rate and Rhythm: Normal rate.  Pulmonary:     Effort: Pulmonary effort is normal.  Neurological:     Mental Status: He is alert.  Psychiatric:        Mood and Affect: Mood normal.        Behavior: Behavior normal.        Thought Content: Thought content normal.     UC Treatments / Results  Labs (all labs ordered are listed, but only abnormal results are displayed) Labs Reviewed  CYTOLOGY, (ORAL, ANAL, URETHRAL) ANCILLARY ONLY    EKG   Radiology No results found.  Procedures Procedures (including critical care time)  Medications Ordered in UC Medications - No data to display  Initial Impression / Assessment and Plan / UC Course  I have reviewed the triage vital signs and the nursing  notes.  Pertinent labs & imaging results that were available during my care of the patient were reviewed by me and considered in my medical decision making (see chart for details).    STD screening ordered. Will await results for further recommendation. Recommended abstinence while awaiting test results.   Final Clinical Impressions(s) / UC Diagnoses   Final diagnoses:  Screening for STD (sexually transmitted disease)   Discharge Instructions   None    ED Prescriptions   None    PDMP not reviewed this encounter.   Tomi Bamberger, PA-C 05/05/21 1025

## 2021-05-05 NOTE — ED Triage Notes (Signed)
Pt c/o discharge x1day. Pt wants to be checked for gonorrhea. Pt had unprotected sex on 05/02/21

## 2021-05-06 LAB — CYTOLOGY, (ORAL, ANAL, URETHRAL) ANCILLARY ONLY
Chlamydia: NEGATIVE
Comment: NEGATIVE
Comment: NEGATIVE
Comment: NORMAL
Neisseria Gonorrhea: NEGATIVE
Trichomonas: POSITIVE — AB

## 2021-05-07 ENCOUNTER — Telehealth (HOSPITAL_COMMUNITY): Payer: Self-pay | Admitting: Emergency Medicine

## 2021-05-07 MED ORDER — METRONIDAZOLE 500 MG PO TABS: 2000.0000 mg | ORAL_TABLET | Freq: Once | ORAL | 0 refills | Status: AC

## 2021-06-11 NOTE — Progress Notes (Deleted)
NEUROLOGY CONSULTATION NOTE  Sadao Weyer MRN: [id number] DOB: Oct 08, 2000  Referring provider: Ervin Knack, FNP (Urgent Care referral) Primary care provider: No PCP  Reason for consult:  headaches  Assessment/Plan:   ***   Subjective:  Christopher Larsen is a 21 year old male who presents for headaches.  History supplemented by referring provider's note.  Onset:  2016. Location:  right retro-orbital Quality:  throbbing Intensity:  9/10.  Not thunderclap headache Aura:  *** Prodrome:  *** Postdrome:  *** Associated symptoms:  Nausea, dizziness, blurred vision, nasal congestion.  *** denies associated unilateral numbness or weakness. Duration:  *** Frequency:  *** Frequency of abortive medication: *** Triggers:  *** Relieving factors:  *** Activity:  ***  Prior workup included CT head on 05/06/2018, which was personally reviewed and was normal.   Current NSAIDS/analgesics:  ibuprofen, Excedrin Current triptans:  sumatriptan 25mg  Current ergotamine:  none Current anti-emetic:  Zofran ODT 8mg  Current muscle relaxants:  none Current Antihypertensive medications:  none Current Antidepressant medications:  none Current Anticonvulsant medications:  none Current anti-CGRP:  none Current Vitamins/Herbal/Supplements:  none Current Antihistamines/Decongestants:  none Other therapy:  *** Hormone/birth control:  none   Past NSAIDS/analgesics:  *** Past abortive triptans:  none Past abortive ergotamine:  none Past muscle relaxants:  none Past anti-emetic:  none Past antihypertensive medications:  none Past antidepressant medications:  none Past anticonvulsant medications:  none Past anti-CGRP:  none Past vitamins/Herbal/Supplements:  none Past antihistamines/decongestants:  loratidine Other past therapies:  none  Caffeine:  *** Alcohol:  *** Smoker:  *** Diet:  *** Exercise:  *** Depression:  ***; Anxiety:  *** Other pain:  *** Sleep hygiene:  *** Family  history of headache:  ***      PAST MEDICAL HISTORY: No past medical history on file.  PAST SURGICAL HISTORY: Past Surgical History:  Procedure Laterality Date   HERNIA REPAIR      MEDICATIONS: Current Outpatient Medications on File Prior to Visit  Medication Sig Dispense Refill   ibuprofen (ADVIL,MOTRIN) 600 MG tablet Take 1 tablet (600 mg total) by mouth every 6 (six) hours as needed. 30 tablet 0   ondansetron (ZOFRAN-ODT) 8 MG disintegrating tablet Take 1 tablet (8 mg total) by mouth every 8 (eight) hours as needed for nausea or vomiting. 20 tablet 0   SUMAtriptan (IMITREX) 25 MG tablet Take 1 tablet at the start of a migraine. If the pain persists after 2 hours take 1 more tablet. Do not exceed 2 doses in 24 hours. 10 tablet 0   No current facility-administered medications on file prior to visit.    ALLERGIES: No Known Allergies  FAMILY HISTORY: Family History  Family history unknown: Yes    Objective:  *** General: No acute distress.  Patient appears well-groomed.   Head:  Normocephalic/atraumatic Eyes:  fundi examined but not visualized Neck: supple, no paraspinal tenderness, full range of motion Back: No paraspinal tenderness Heart: regular rate and rhythm Lungs: Clear to auscultation bilaterally. Vascular: No carotid bruits. Neurological Exam: Mental status: alert and oriented to person, place, and time, recent and remote memory intact, fund of knowledge intact, attention and concentration intact, speech fluent and not dysarthric, language intact. Cranial nerves: CN I: not tested CN II: pupils equal, round and reactive to light, visual fields intact CN III, IV, VI:  full range of motion, no nystagmus, no ptosis CN V: facial sensation intact. CN VII: upper and lower face symmetric CN VIII: hearing intact CN IX, X: gag intact, uvula  midline CN XI: sternocleidomastoid and trapezius muscles intact CN XII: tongue midline Bulk & Tone: normal, no  fasciculations. Motor:  muscle strength 5/5 throughout Sensation:  Pinprick, temperature and vibratory sensation intact. Deep Tendon Reflexes:  2+ throughout,  toes downgoing.   Finger to nose testing:  Without dysmetria.   Heel to shin:  Without dysmetria.   Gait:  Normal station and stride.  Romberg negative.    Thank you for allowing me to take part in the care of this patient.  Shon Millet, DO  CC: ***

## 2021-06-12 ENCOUNTER — Ambulatory Visit: Payer: Self-pay | Admitting: Neurology

## 2021-06-12 ENCOUNTER — Encounter: Payer: Self-pay | Admitting: Neurology

## 2021-06-12 DIAGNOSIS — Z029 Encounter for administrative examinations, unspecified: Secondary | ICD-10-CM

## 2021-06-24 ENCOUNTER — Ambulatory Visit
Admission: RE | Admit: 2021-06-24 | Discharge: 2021-06-24 | Disposition: A | Discharge status: Home / Self Care | Payer: Self-pay | Source: Ambulatory Visit | Attending: Internal Medicine | Admitting: Internal Medicine

## 2021-06-24 ENCOUNTER — Other Ambulatory Visit: Payer: Self-pay

## 2021-06-24 VITALS — BP 113/72 | HR 70 | Temp 98.1°F | Resp 18 | Ht 75.0 in | Wt 180.0 lb

## 2021-06-24 DIAGNOSIS — Z113 Encounter for screening for infections with a predominantly sexual mode of transmission: Secondary | ICD-10-CM

## 2021-06-24 NOTE — ED Triage Notes (Signed)
Patient here for STI check, no sx's.

## 2021-06-24 NOTE — Discharge Instructions (Signed)
Your penile swab is pending.  We will call if results are positive and treat as appropriate.  Please refrain from sexual activity until test results and treatment are complete. 

## 2021-06-24 NOTE — ED Provider Notes (Signed)
[facility]    CSN: [id number] Arrival date & time: 06/24/21  4034      History   Chief Complaint Chief Complaint  Patient presents with   Appointment    STI exposure    HPI Christopher Larsen is a 21 y.o. male.   Patient presents for routine STD testing.  Patient denies any known symptoms or exposure.  He tested positive for trichomonas in December and wants to be sure that this is clear.  Patient does not want HIV or syphilis testing.    History reviewed. No pertinent past medical history.  Patient Active Problem List   Diagnosis Date Noted   DIZZINESS 08/13/2008   DYSARTHRIA 08/13/2008   HEADACHE 08/06/2008   DERMATITIS, ATOPIC 05/19/2007   RHINITIS, ALLERGIC 07/29/2006    Past Surgical History:  Procedure Laterality Date   HERNIA REPAIR         Home Medications    Prior to Admission medications   Medication Sig Start Date End Date Taking? Authorizing Provider  ibuprofen (ADVIL,MOTRIN) 600 MG tablet Take 1 tablet (600 mg total) by mouth every 6 (six) hours as needed. 01/18/16   Eliseo Squires, PA-C  ondansetron (ZOFRAN-ODT) 8 MG disintegrating tablet Take 1 tablet (8 mg total) by mouth every 8 (eight) hours as needed for nausea or vomiting. 02/24/21   Wallis Bamberg, PA-C  SUMAtriptan (IMITREX) 25 MG tablet Take 1 tablet at the start of a migraine. If the pain persists after 2 hours take 1 more tablet. Do not exceed 2 doses in 24 hours. 02/24/21   Wallis Bamberg, PA-C    Family History Family History  Family history unknown: Yes    Social History Social History   Tobacco Use   Smoking status: Never   Smokeless tobacco: Never  Vaping Use   Vaping Use: Never used  Substance Use Topics   Alcohol use: Yes   Drug use: No     Allergies   Patient has no known allergies.   Review of Systems Review of Systems Per HPI  Physical Exam Triage Vital Signs ED Triage Vitals [06/24/21 0905]  Enc Vitals Group     BP 113/72     Pulse Rate 70      Resp 18     Temp 98.1 F (36.7 C)     Temp Source Oral     SpO2 97 %     Weight 180 lb (81.6 kg)     Height 6\' 3"  (1.905 m)     Head Circumference      Peak Flow      Pain Score 0     Pain Loc      Pain Edu?      Excl. in GC?    No data found.  Updated Vital Signs BP 113/72 (BP Location: Left Arm)    Pulse 70    Temp 98.1 F (36.7 C) (Oral)    Resp 18    Ht 6\' 3"  (1.905 m)    Wt 180 lb (81.6 kg)    SpO2 97%    BMI 22.50 kg/m   Visual Acuity Right Eye Distance:   Left Eye Distance:   Bilateral Distance:    Right Eye Near:   Left Eye Near:    Bilateral Near:     Physical Exam Constitutional:      Appearance: Normal appearance.  HENT:     Head: Normocephalic and atraumatic.  Eyes:     Extraocular Movements: Extraocular movements intact.  Conjunctiva/sclera: Conjunctivae normal.  Pulmonary:     Effort: Pulmonary effort is normal.  Genitourinary:    Comments: Deferred with shared decision making.  Self swab performed. Neurological:     General: No focal deficit present.     Mental Status: He is alert and oriented to person, place, and time. Mental status is at baseline.  Psychiatric:        Mood and Affect: Mood normal.        Behavior: Behavior normal.        Thought Content: Thought content normal.        Judgment: Judgment normal.     UC Treatments / Results  Labs (all labs ordered are listed, but only abnormal results are displayed) Labs Reviewed  CYTOLOGY, (ORAL, ANAL, URETHRAL) ANCILLARY ONLY    EKG   Radiology No results found.  Procedures Procedures (including critical care time)  Medications Ordered in UC Medications - No data to display  Initial Impression / Assessment and Plan / UC Course  I have reviewed the triage vital signs and the nursing notes.  Pertinent labs & imaging results that were available during my care of the patient were reviewed by me and considered in my medical decision making (see chart for details).      Routine STD testing completed per patient request.  Patient to refrain from sexual activity until test results and treatment are complete.  Discussed return precautions.  Patient verbalized understanding and was agreeable with plan. Final Clinical Impressions(s) / UC Diagnoses   Final diagnoses:  Screening examination for venereal disease     Discharge Instructions      Your penile swab is pending.  We will call if results are positive and treat as appropriate.  Please refrain from sexual activity until test results and treatment are complete.    ED Prescriptions   None    PDMP not reviewed this encounter.   Gustavus Bryant, [place] 06/24/21 [phone]

## 2021-06-25 LAB — CYTOLOGY, (ORAL, ANAL, URETHRAL) ANCILLARY ONLY
Chlamydia: NEGATIVE
Comment: NEGATIVE
Comment: NEGATIVE
Comment: NORMAL
Neisseria Gonorrhea: NEGATIVE
Trichomonas: POSITIVE — AB

## 2021-06-26 ENCOUNTER — Ambulatory Visit
Admission: EM | Admit: 2021-06-26 | Discharge: 2021-06-26 | Disposition: A | Discharge status: Home / Self Care | Payer: Self-pay | Attending: Nurse Practitioner | Admitting: Nurse Practitioner

## 2021-06-26 ENCOUNTER — Encounter: Payer: Self-pay | Admitting: Emergency Medicine

## 2021-06-26 ENCOUNTER — Other Ambulatory Visit: Payer: Self-pay

## 2021-06-26 DIAGNOSIS — A599 Trichomoniasis, unspecified: Secondary | ICD-10-CM

## 2021-06-26 DIAGNOSIS — J111 Influenza due to unidentified influenza virus with other respiratory manifestations: Secondary | ICD-10-CM

## 2021-06-26 DIAGNOSIS — Z20822 Contact with and (suspected) exposure to covid-19: Secondary | ICD-10-CM

## 2021-06-26 DIAGNOSIS — Z1152 Encounter for screening for COVID-19: Secondary | ICD-10-CM

## 2021-06-26 LAB — POCT INFLUENZA A/B
Influenza A, POC: NEGATIVE
Influenza B, POC: NEGATIVE

## 2021-06-26 MED ORDER — METRONIDAZOLE 500 MG PO TABS: 500.0000 mg | ORAL_TABLET | Freq: Two times a day (BID) | ORAL | 0 refills | Status: DC

## 2021-06-26 MED ORDER — OSELTAMIVIR PHOSPHATE 75 MG PO CAPS: 75.0000 mg | ORAL_CAPSULE | Freq: Two times a day (BID) | ORAL | 0 refills | Status: AC

## 2021-06-26 NOTE — Discharge Instructions (Addendum)
Take medications as prescribed.  Do not have sex until 7-10 days after you finish your medicine Do not drink alcohol while on the metronidazole  Notify sexual partner(s) about your infection  Make sure that your partner(s) gets tested and treated You may take tylenol or ibuprofen as needed for fevers/headache/body aches.  Drink plenty of fluids.  Stay in home isolation until you receive results of your COVID test.  You will only be notified for positive results. You may go online to MyChart and review your results.  Go to the ED immediately if you get worse or have any other symptoms.

## 2021-06-26 NOTE — ED Triage Notes (Signed)
Patient c/o fever, cough, runny nose x 1 day.  Patient requesting a flu test, has taken Aleve and Nyquil.

## 2021-06-26 NOTE — ED Provider Notes (Signed)
[facility]    CSN: [id number] Arrival date & time: 06/26/21  0955      History   Chief Complaint Chief Complaint  Patient presents with   Fever    HPI Christopher Larsen is a 21 y.o. male.   Subjective:   Christopher CopaJahmere Vanamburg is a 21 y.o. male who presents for evaluation of influenza like symptoms. Symptoms include suspected fevers but not measured at home, cold chills, headache, myalgias, nasal discharge, productive cough, sneezing, and sore throat and have been present for 2 days. He has tried to alleviate the symptoms with naproxen and Nyquil with moderate relief. High risk factors for influenza complications: none. Patient's niece tested positive for COVID 3 days ago. Patient took a rapid COVID test 2 days ago which was negative.  He denies history of COVID.  Patient has not had any of the COVID vaccines.  Notably, patient was here on 06/24/2021 for routine STD testing.  He was asymptomatic at that time.  He had tested positive for trichomonas in December and wanted to be sure that it was clear.  Refused any other testing at that time.  Notes from testing positive for trichomonas.  Negative for gonorrhea or chlamydia.  Patient has not been notified of these results.  The following portions of the patient's history were reviewed and updated as appropriate: allergies, current medications, past family history, past medical history, past social history, past surgical history, and problem list.        History reviewed. No pertinent past medical history.  Patient Active Problem List   Diagnosis Date Noted   DIZZINESS 08/13/2008   DYSARTHRIA 08/13/2008   HEADACHE 08/06/2008   DERMATITIS, ATOPIC 05/19/2007   RHINITIS, ALLERGIC 07/29/2006    Past Surgical History:  Procedure Laterality Date   HERNIA REPAIR         Home Medications    Prior to Admission medications   Medication Sig Start Date End Date Taking? Authorizing Provider  ibuprofen (ADVIL,MOTRIN) 600 MG  tablet Take 1 tablet (600 mg total) by mouth every 6 (six) hours as needed. 01/18/16  Yes London SheerEldridge, Serena Y, PA-C  metroNIDAZOLE (FLAGYL) 500 MG tablet Take 1 tablet (500 mg total) by mouth 2 (two) times daily. 06/26/21  Yes Lurline IdolMurrill, Jenni Thew, FNP  ondansetron (ZOFRAN-ODT) 8 MG disintegrating tablet Take 1 tablet (8 mg total) by mouth every 8 (eight) hours as needed for nausea or vomiting. 02/24/21  Yes Wallis BambergMani, Mario, PA-C  oseltamivir (TAMIFLU) 75 MG capsule Take 1 capsule (75 mg total) by mouth every 12 (twelve) hours. 06/26/21  Yes Lurline IdolMurrill, Andria Head, FNP  SUMAtriptan (IMITREX) 25 MG tablet Take 1 tablet at the start of a migraine. If the pain persists after 2 hours take 1 more tablet. Do not exceed 2 doses in 24 hours. 02/24/21  Yes Wallis BambergMani, Mario, PA-C    Family History Family History  Problem Relation Age of Onset   Healthy Father    Healthy Brother     Social History Social History   Tobacco Use   Smoking status: Never   Smokeless tobacco: Never  Vaping Use   Vaping Use: Never used  Substance Use Topics   Alcohol use: Yes   Drug use: No     Allergies   Patient has no known allergies.   Review of Systems Review of Systems  Constitutional:  Positive for chills and fever.  HENT:  Positive for congestion, rhinorrhea and sore throat.   Respiratory:  Positive for cough. Negative for shortness of  breath and wheezing.   Gastrointestinal:  Negative for diarrhea, nausea and vomiting.  Genitourinary:  Negative for dysuria, genital sores, penile discharge, penile pain, penile swelling and scrotal swelling.  Musculoskeletal:  Negative for myalgias.  Neurological:  Positive for headaches.  All other systems reviewed and are negative.   Physical Exam Triage Vital Signs ED Triage Vitals [06/26/21 1208]  Enc Vitals Group     BP 113/78     Pulse Rate 70     Resp      Temp 98.6 F (37 C)     Temp Source Oral     SpO2 97 %     Weight 179 lb 14.3 oz (81.6 kg)     Height 6\' 3"  (1.905  m)     Head Circumference      Peak Flow      Pain Score 3     Pain Loc      Pain Edu?      Excl. in GC?    No data found.  Updated Vital Signs BP 113/78 (BP Location: Left Arm)    Pulse 70    Temp 98.6 F (37 C) (Oral)    Ht 6\' 3"  (1.905 m)    Wt 179 lb 14.3 oz (81.6 kg)    SpO2 97%    BMI 22.49 kg/m   Visual Acuity Right Eye Distance:   Left Eye Distance:   Bilateral Distance:    Right Eye Near:   Left Eye Near:    Bilateral Near:     Physical Exam Constitutional:      General: He is not in acute distress.    Appearance: Normal appearance. He is not ill-appearing, toxic-appearing or diaphoretic.  HENT:     Head: Normocephalic.     Nose: Nose normal.     Mouth/Throat:     Mouth: Mucous membranes are moist.  Eyes:     Conjunctiva/sclera: Conjunctivae normal.  Cardiovascular:     Rate and Rhythm: Normal rate.  Pulmonary:     Effort: Pulmonary effort is normal.  Musculoskeletal:        General: Normal range of motion.     Cervical back: Normal range of motion and neck supple.  Skin:    General: Skin is warm and dry.  Neurological:     General: No focal deficit present.     Mental Status: He is alert and oriented to person, place, and time.  Psychiatric:        Mood and Affect: Mood normal.     UC Treatments / Results  Labs (all labs ordered are listed, but only abnormal results are displayed) Labs Reviewed  NOVEL CORONAVIRUS, NAA  POCT INFLUENZA A/B    EKG   Radiology No results found.  Procedures Procedures (including critical care time)  Medications Ordered in UC Medications - No data to display  Initial Impression / Assessment and Plan / UC Course  I have reviewed the triage vital signs and the nursing notes.  Pertinent labs & imaging results that were available during my care of the patient were reviewed by me and considered in my medical decision making (see chart for details).    21 year old male presenting with acute subjective  fevers, chills, headache, myalgias, runny nose, productive cough, sneezing and sore throat for the past couple days.  He is also had known COVID exposure outside the home.  Patient is afebrile.  Nontoxic.  Flu test negative.  COVID test pending.  Distantly, patient was  seen here a couple of days ago for routine STD testing.  He has tested positive for trichomonas.  Patient has been notified of these results today and will be treated.  Advised patient to finish all medications.  Notify all sexual partners.  Patient and sexual partners will need to complete treatment in order to prevent reinfection.  Also advised continued supportive care with antipyretics and fluids.  Will provide Tamiflu prophylactically.  Home isolation until COVID test has resulted.   Today's evaluation has revealed no signs of a dangerous process. Discussed diagnosis with patient and/or guardian. Patient and/or guardian aware of their diagnosis, possible red flag symptoms to watch out for and need for close follow up. Patient and/or guardian understands verbal and written discharge instructions. Patient and/or guardian comfortable with plan and disposition.  Patient and/or guardian has a clear mental status at this time, good insight into illness (after discussion and teaching) and has clear judgment to make decisions regarding their care  This care was provided during an unprecedented National Emergency due to the Novel Coronavirus (COVID-19) pandemic. COVID-19 infections and transmission risks place heavy strains on healthcare resources.  As this pandemic evolves, our facility, providers, and staff strive to respond fluidly, to remain operational, and to provide care relative to available resources and information. Outcomes are unpredictable and treatments are without well-defined guidelines. Further, the impact of COVID-19 on all aspects of urgent care, including the impact to patients seeking care for reasons other than COVID-19, is  unavoidable during this national emergency. At this time of the global pandemic, management of patients has significantly changed, even for non-COVID positive patients given high local and regional COVID volumes at this time requiring high healthcare system and resource utilization. The standard of care for management of both COVID suspected and non-COVID suspected patients continues to change rapidly at the local, regional, national, and global levels. This patient was worked up and treated to the best available but ever changing evidence and resources available at this current time.   Documentation was completed with the aid of voice recognition software. Transcription may contain typographical errors. Final Clinical Impressions(s) / UC Diagnoses   Final diagnoses:  Contact with and (suspected) exposure to covid-19  Influenza-like illness  Encounter for screening for COVID-19  Trichomonosis     Discharge Instructions      Take medications as prescribed.  Do not have sex until 7-10 days after you finish your medicine, Notify sexual partner(s) about your infection  Make sure that your partner(s) gets tested and treated You may take tylenol or ibuprofen as needed for fevers/headache/body aches.  Drink plenty of fluids.  Stay in home isolation until you receive results of your COVID test.  You will only be notified for positive results. You may go online to MyChart and review your results.  Go to the ED immediately if you get worse or have any other symptoms.       ED Prescriptions     Medication Sig Dispense Auth. Provider   oseltamivir (TAMIFLU) 75 MG capsule Take 1 capsule (75 mg total) by mouth every 12 (twelve) hours. 10 capsule Lurline Idol, FNP   metroNIDAZOLE (FLAGYL) 500 MG tablet Take 1 tablet (500 mg total) by mouth 2 (two) times daily. 14 tablet Lurline Idol, FNP      PDMP not reviewed this encounter.   Lurline Idol, [place] 06/26/21 1321

## 2021-06-27 ENCOUNTER — Telehealth: Payer: Self-pay

## 2021-06-27 LAB — NOVEL CORONAVIRUS, NAA: SARS-CoV-2, NAA: DETECTED — AB

## 2021-06-27 LAB — SARS-COV-2, NAA 2 DAY TAT

## 2021-09-15 ENCOUNTER — Ambulatory Visit
Admission: RE | Admit: 2021-09-15 | Discharge: 2021-09-15 | Disposition: A | Discharge status: Home / Self Care | Payer: Self-pay | Source: Ambulatory Visit | Attending: Internal Medicine | Admitting: Internal Medicine

## 2021-09-15 VITALS — BP 125/75 | HR 81 | Temp 98.0°F | Resp 18 | Ht 75.0 in | Wt 180.0 lb

## 2021-09-15 DIAGNOSIS — Z113 Encounter for screening for infections with a predominantly sexual mode of transmission: Secondary | ICD-10-CM | POA: Insufficient documentation

## 2021-09-15 NOTE — ED Provider Notes (Signed)
?[facility] ? ? ? ?CSN: [id number] ?Arrival date & time: 09/15/21  0840 ? ? ?  ? ?History   ?Chief Complaint ?Chief Complaint  ?Patient presents with  ? Exposure to STD  ?  Entered by patient  ? Appointment  ? ? ?HPI ?Christopher Larsen is a 21 y.o. male.  ? ?Patient presents for routine STD testing.  Denies any symptoms including urinary frequency, dysuria, penile discharge, testicular pain, abdominal pain, fever, back pain.  Denies any known exposure to STD.  Patient  also REQUESTING BLOOD WORK FOR HIV AND SYPHILIS. ? ? ?Exposure to STD ? ? ?History reviewed. No pertinent past medical history. ? ?Patient Active Problem List  ? Diagnosis Date Noted  ? DIZZINESS 08/13/2008  ? DYSARTHRIA 08/13/2008  ? HEADACHE 08/06/2008  ? DERMATITIS, ATOPIC 05/19/2007  ? RHINITIS, ALLERGIC 07/29/2006  ? ? ?Past Surgical History:  ?Procedure Laterality Date  ? HERNIA REPAIR    ? ? ? ? ? ?Home Medications   ? ?Prior to Admission medications   ?Medication Sig Start Date End Date Taking? Authorizing Provider  ?ibuprofen (ADVIL,MOTRIN) 600 MG tablet Take 1 tablet (600 mg total) by mouth every 6 (six) hours as needed. 01/18/16   Eliseo Squires, PA-C  ?metroNIDAZOLE (FLAGYL) 500 MG tablet Take 1 tablet (500 mg total) by mouth 2 (two) times daily. 06/26/21   Lurline Idol, FNP  ?ondansetron (ZOFRAN-ODT) 8 MG disintegrating tablet Take 1 tablet (8 mg total) by mouth every 8 (eight) hours as needed for nausea or vomiting. 02/24/21   Wallis Bamberg, PA-C  ?oseltamivir (TAMIFLU) 75 MG capsule Take 1 capsule (75 mg total) by mouth every 12 (twelve) hours. 06/26/21   Lurline Idol, FNP  ?SUMAtriptan (IMITREX) 25 MG tablet Take 1 tablet at the start of a migraine. If the pain persists after 2 hours take 1 more tablet. Do not exceed 2 doses in 24 hours. 02/24/21   Wallis Bamberg, PA-C  ? ? ?Family History ?Family History  ?Problem Relation Age of Onset  ? Healthy Father   ? Healthy Brother   ? ? ?Social History ?Social History   ? ?Tobacco Use  ? Smoking status: Never  ? Smokeless tobacco: Never  ?Vaping Use  ? Vaping Use: Never used  ?Substance Use Topics  ? Alcohol use: Yes  ? Drug use: No  ? ? ? ?Allergies   ?Patient has no known allergies. ? ? ?Review of Systems ?Review of Systems ?Per HPI ? ?Physical Exam ?Triage Vital Signs ?ED Triage Vitals [09/15/21 0906]  ?Enc Vitals Group  ?   BP 125/75  ?   Pulse Rate 81  ?   Resp 18  ?   Temp 98 ?F (36.7 ?C)  ?   Temp Source Oral  ?   SpO2 98 %  ?   Weight 180 lb (81.6 kg)  ?   Height 6\' 3"  (1.905 m)  ?   Head Circumference   ?   Peak Flow   ?   Pain Score 0  ?   Pain Loc   ?   Pain Edu?   ?   Excl. in [place]?   ? ?No data found. ? ?Updated Vital Signs ?BP 125/75 (BP Location: Left Arm)   Pulse 81   Temp 98 ?F (36.7 ?C) (Oral)   Resp 18   Ht 6\' 3"  (1.905 m)   Wt 180 lb (81.6 kg)   SpO2 98%   BMI 22.50 kg/m?  ? ?Visual Acuity ?Right  Eye Distance:   ?Left Eye Distance:   ?Bilateral Distance:   ? ?Right Eye Near:   ?Left Eye Near:    ?Bilateral Near:    ? ?Physical Exam ?Constitutional:   ?   General: He is not in acute distress. ?   Appearance: Normal appearance. He is not toxic-appearing or diaphoretic.  ?HENT:  ?   Head: Normocephalic and atraumatic.  ?Eyes:  ?   Extraocular Movements: Extraocular movements intact.  ?   Conjunctiva/sclera: Conjunctivae normal.  ?Pulmonary:  ?   Effort: Pulmonary effort is normal.  ?Genitourinary: ?   Comments: Deferred with shared decision making.  Self swab performed. ?Neurological:  ?   General: No focal deficit present.  ?   Mental Status: He is alert and oriented to person, place, and time. Mental status is at baseline.  ?Psychiatric:     ?   Mood and Affect: Mood normal.     ?   Behavior: Behavior normal.     ?   Thought Content: Thought content normal.     ?   Judgment: Judgment normal.  ? ? ? ?UC Treatments / Results  ?Labs ?(all labs ordered are listed, but only abnormal results are displayed) ?Labs Reviewed  ?RPR  ?HIV ANTIBODY (ROUTINE TESTING W  REFLEX)  ?CYTOLOGY, (ORAL, ANAL, URETHRAL) ANCILLARY ONLY  ? ? ?EKG ? ? ?Radiology ?No results found. ? ?Procedures ?Procedures (including critical care time) ? ?Medications Ordered in UC ?Medications - No data to display ? ?Initial Impression / Assessment and Plan / UC Course  ?I have reviewed the triage vital signs and the nursing notes. ? ?Pertinent labs & imaging results that were available during my care of the patient were reviewed by me and considered in my medical decision making (see chart for details). ? ?  ? ?Routine cytology swab and blood work for HIV and syphilis pending.  Will treat if necessary once results are complete.  Patient to refrain from sexual activity until test results and treatment are complete.  Patient verbalized understanding and was agreeable with plan. ?Final Clinical Impressions(s) / UC Diagnoses  ? ?Final diagnoses:  ?Screening examination for venereal disease  ? ? ? ?Discharge Instructions   ? ?  ?Your STD swab and blood work are pending.  We will call if they are positive and treat as appropriate.  Please refrain from sexual activity until test results and treatment are complete. ? ? ? ?ED Prescriptions   ?None ?  ? ?PDMP not reviewed this encounter. ?  ?Gustavus Bryant, [place] ?09/15/21 0254 ? ?

## 2021-09-15 NOTE — Discharge Instructions (Signed)
Your STD swab and blood work are pending.  We will call if they are positive and treat as appropriate.  Please refrain from sexual activity until test results and treatment are complete. ?

## 2021-09-15 NOTE — ED Triage Notes (Signed)
Patient requesting STI testing, no sx's.  Does not want bloodwork. ?

## 2021-09-16 ENCOUNTER — Telehealth (HOSPITAL_COMMUNITY): Payer: Self-pay | Admitting: Emergency Medicine

## 2021-09-16 LAB — CYTOLOGY, (ORAL, ANAL, URETHRAL) ANCILLARY ONLY
Chlamydia: NEGATIVE
Comment: NEGATIVE
Comment: NEGATIVE
Comment: NORMAL
Neisseria Gonorrhea: NEGATIVE
Trichomonas: POSITIVE — AB

## 2021-09-16 LAB — HIV ANTIBODY (ROUTINE TESTING W REFLEX): HIV Screen 4th Generation wRfx: NONREACTIVE

## 2021-09-16 LAB — RPR: RPR Ser Ql: NONREACTIVE

## 2021-09-16 MED ORDER — METRONIDAZOLE 500 MG PO TABS: 2000.0000 mg | ORAL_TABLET | Freq: Once | ORAL | 0 refills | Status: AC

## 2021-10-17 ENCOUNTER — Ambulatory Visit
Admission: RE | Admit: 2021-10-17 | Discharge: 2021-10-17 | Disposition: A | Discharge status: Home / Self Care | Payer: Self-pay | Source: Ambulatory Visit | Attending: Internal Medicine | Admitting: Internal Medicine

## 2021-10-17 VITALS — BP 110/71 | HR 70 | Temp 97.8°F | Resp 18

## 2021-10-17 DIAGNOSIS — Z202 Contact with and (suspected) exposure to infections with a predominantly sexual mode of transmission: Secondary | ICD-10-CM | POA: Insufficient documentation

## 2021-10-17 DIAGNOSIS — Z113 Encounter for screening for infections with a predominantly sexual mode of transmission: Secondary | ICD-10-CM | POA: Insufficient documentation

## 2021-10-17 MED ORDER — METRONIDAZOLE 500 MG PO TABS
2000.0000 mg | ORAL_TABLET | Freq: Once | ORAL | Status: AC
  Administered 2021-10-17: 2000 mg via ORAL

## 2021-10-17 NOTE — Discharge Instructions (Signed)
You have been treated for trichomoniasis exposure with flagyl antibiotic. Your STD swab is pending. Please refrain from sexual activity until test results and treatment are complete.

## 2021-10-17 NOTE — ED Provider Notes (Signed)
[facility]    CSN: [id number] Arrival date & time: 10/17/21  0940      History   Chief Complaint Chief Complaint  Patient presents with   Exposure to STD    Entered by patient    HPI Christopher Larsen is a 21 y.o. male.   Patient presents with exposure to trichomoniasis from recent sexual partner.  He did have unprotected sexual intercourse with her.  Denies any associated symptoms including dysuria, penile discharge, urinary frequency, testicular pain, fever, back pain, abdominal pain.   Exposure to STD   History reviewed. No pertinent past medical history.  Patient Active Problem List   Diagnosis Date Noted   DIZZINESS 08/13/2008   DYSARTHRIA 08/13/2008   HEADACHE 08/06/2008   DERMATITIS, ATOPIC 05/19/2007   RHINITIS, ALLERGIC 07/29/2006    Past Surgical History:  Procedure Laterality Date   HERNIA REPAIR         Home Medications    Prior to Admission medications   Medication Sig Start Date End Date Taking? Authorizing Provider  ibuprofen (ADVIL,MOTRIN) 600 MG tablet Take 1 tablet (600 mg total) by mouth every 6 (six) hours as needed. 01/18/16   Tomi Likens, PA-C  ondansetron (ZOFRAN-ODT) 8 MG disintegrating tablet Take 1 tablet (8 mg total) by mouth every 8 (eight) hours as needed for nausea or vomiting. 02/24/21   Jaynee Eagles, PA-C  oseltamivir (TAMIFLU) 75 MG capsule Take 1 capsule (75 mg total) by mouth every 12 (twelve) hours. 06/26/21   Enrique Sack, FNP  SUMAtriptan (IMITREX) 25 MG tablet Take 1 tablet at the start of a migraine. If the pain persists after 2 hours take 1 more tablet. Do not exceed 2 doses in 24 hours. 02/24/21   Jaynee Eagles, PA-C    Family History Family History  Problem Relation Age of Onset   Healthy Father    Healthy Brother     Social History Social History   Tobacco Use   Smoking status: Never   Smokeless tobacco: Never  Vaping Use   Vaping Use: Never used  Substance Use Topics   Alcohol use: Yes    Drug use: No     Allergies   Patient has no known allergies.   Review of Systems Review of Systems Per HPI  Physical Exam Triage Vital Signs ED Triage Vitals  Enc Vitals Group     BP 10/17/21 1027 110/71     Pulse Rate 10/17/21 1027 (!) 55     Resp 10/17/21 1027 18     Temp 10/17/21 1027 97.8 F (36.6 C)     Temp Source 10/17/21 1027 Oral     SpO2 10/17/21 1027 98 %     Weight --      Height --      Head Circumference --      Peak Flow --      Pain Score 10/17/21 1028 0     Pain Loc --      Pain Edu? --      Excl. in [place]? --    No data found.  Updated Vital Signs BP 110/71 (BP Location: Left Arm)   Pulse 70   Temp 97.8 F (36.6 C) (Oral)   Resp 18   SpO2 98%   Visual Acuity Right Eye Distance:   Left Eye Distance:   Bilateral Distance:    Right Eye Near:   Left Eye Near:    Bilateral Near:     Physical Exam Constitutional:  General: He is not in acute distress.    Appearance: Normal appearance. He is not toxic-appearing or diaphoretic.  HENT:     Head: Normocephalic and atraumatic.  Eyes:     Extraocular Movements: Extraocular movements intact.     Conjunctiva/sclera: Conjunctivae normal.  Pulmonary:     Effort: Pulmonary effort is normal.  Genitourinary:    Comments: Deferred with shared decision making. Self swab performed.  Neurological:     General: No focal deficit present.     Mental Status: He is alert and oriented to person, place, and time. Mental status is at baseline.  Psychiatric:        Mood and Affect: Mood normal.        Behavior: Behavior normal.        Thought Content: Thought content normal.        Judgment: Judgment normal.     UC Treatments / Results  Labs (all labs ordered are listed, but only abnormal results are displayed) Labs Reviewed  CYTOLOGY, (ORAL, ANAL, URETHRAL) ANCILLARY ONLY    EKG   Radiology No results found.  Procedures Procedures (including critical care time)  Medications Ordered in  UC Medications  metroNIDAZOLE (FLAGYL) tablet 2,000 mg (has no administration in time range)    Initial Impression / Assessment and Plan / UC Course  I have reviewed the triage vital signs and the nursing notes.  Pertinent labs & imaging results that were available during my care of the patient were reviewed by me and considered in my medical decision making (see chart for details).      Will prophylactically treat for trichomoniasis exposure with 2 g of Flagyl here in urgent care today.  Cytology swab pending.  Patient advised of safe sex practices and to refrain from sexual activity until test results and treatment are complete.  Discussed strict return precautions.  Patient verbalized understanding and was agreeable with plan.  Final diagnoses:  STD exposure  Screening examination for venereal disease     Discharge Instructions      You have been treated for trichomoniasis exposure with flagyl antibiotic. Your STD swab is pending. Please refrain from sexual activity until test results and treatment are complete.     ED Prescriptions   None    PDMP not reviewed this encounter.   Teodora Medici, [place] 10/17/21 1045

## 2021-10-17 NOTE — ED Triage Notes (Signed)
Pt presents to get STD testing after partner was recently treated for STI; pt denies any symptoms.

## 2021-10-20 LAB — CYTOLOGY, (ORAL, ANAL, URETHRAL) ANCILLARY ONLY
Chlamydia: NEGATIVE
Comment: NEGATIVE
Comment: NEGATIVE
Comment: NORMAL
Neisseria Gonorrhea: NEGATIVE
Trichomonas: NEGATIVE

## 2021-10-29 ENCOUNTER — Ambulatory Visit: Payer: Self-pay

## 2021-11-01 ENCOUNTER — Encounter: Payer: Self-pay | Admitting: Emergency Medicine

## 2021-11-01 ENCOUNTER — Ambulatory Visit
Admission: EM | Admit: 2021-11-01 | Discharge: 2021-11-01 | Disposition: A | Discharge status: Home / Self Care | Payer: Self-pay | Attending: Internal Medicine | Admitting: Internal Medicine

## 2021-11-01 DIAGNOSIS — Z113 Encounter for screening for infections with a predominantly sexual mode of transmission: Secondary | ICD-10-CM | POA: Insufficient documentation

## 2021-11-01 DIAGNOSIS — R369 Urethral discharge, unspecified: Secondary | ICD-10-CM | POA: Insufficient documentation

## 2021-11-01 NOTE — ED Triage Notes (Signed)
Pt woke up today with white penile discharge. Denies dysuria.

## 2021-11-01 NOTE — ED Provider Notes (Signed)
[facility]    CSN: [id number] Arrival date & time: 11/01/21  [id number]      History   Chief Complaint Chief Complaint  Patient presents with   Penile Discharge    HPI Christopher Larsen is a 21 y.o. male.   Patient presents with white penile discharge that started upon awakening today.  Denies any other associated symptoms including dysuria, urinary frequency, testicular pain.  Denies any known exposure to STD but has had unprotected sexual intercourse with his girlfriend.  He denies that she has any associated symptoms.   Penile Discharge   History reviewed. No pertinent past medical history.  Patient Active Problem List   Diagnosis Date Noted   DIZZINESS 08/13/2008   DYSARTHRIA 08/13/2008   HEADACHE 08/06/2008   DERMATITIS, ATOPIC 05/19/2007   RHINITIS, ALLERGIC 07/29/2006    Past Surgical History:  Procedure Laterality Date   HERNIA REPAIR         Home Medications    Prior to Admission medications   Medication Sig Start Date End Date Taking? Authorizing Provider  ibuprofen (ADVIL,MOTRIN) 600 MG tablet Take 1 tablet (600 mg total) by mouth every 6 (six) hours as needed. 01/18/16   Tomi Likens, PA-C  ondansetron (ZOFRAN-ODT) 8 MG disintegrating tablet Take 1 tablet (8 mg total) by mouth every 8 (eight) hours as needed for nausea or vomiting. 02/24/21   Jaynee Eagles, PA-C  oseltamivir (TAMIFLU) 75 MG capsule Take 1 capsule (75 mg total) by mouth every 12 (twelve) hours. 06/26/21   Enrique Sack, FNP  SUMAtriptan (IMITREX) 25 MG tablet Take 1 tablet at the start of a migraine. If the pain persists after 2 hours take 1 more tablet. Do not exceed 2 doses in 24 hours. 02/24/21   Jaynee Eagles, PA-C    Family History Family History  Problem Relation Age of Onset   Healthy Father    Healthy Brother     Social History Social History   Tobacco Use   Smoking status: Never   Smokeless tobacco: Never  Vaping Use   Vaping Use: Never used  Substance  Use Topics   Alcohol use: Yes   Drug use: No     Allergies   Patient has no known allergies.   Review of Systems Review of Systems Per HPI  Physical Exam Triage Vital Signs ED Triage Vitals  Enc Vitals Group     BP 11/01/21 0955 103/66     Pulse Rate 11/01/21 0955 70     Resp 11/01/21 0955 14     Temp 11/01/21 0955 97.9 F (36.6 C)     Temp Source 11/01/21 0955 Oral     SpO2 11/01/21 0955 97 %     Weight --      Height --      Head Circumference --      Peak Flow --      Pain Score 11/01/21 0954 0     Pain Loc --      Pain Edu? --      Excl. in [place]? --    No data found.  Updated Vital Signs BP 103/66 (BP Location: Left Arm)   Pulse 70   Temp 97.9 F (36.6 C) (Oral)   Resp 14   SpO2 97%   Visual Acuity Right Eye Distance:   Left Eye Distance:   Bilateral Distance:    Right Eye Near:   Left Eye Near:    Bilateral Near:     Physical Exam Constitutional:  General: He is not in acute distress.    Appearance: Normal appearance. He is not toxic-appearing or diaphoretic.  HENT:     Head: Normocephalic and atraumatic.  Eyes:     Extraocular Movements: Extraocular movements intact.     Conjunctiva/sclera: Conjunctivae normal.  Pulmonary:     Effort: Pulmonary effort is normal.  Genitourinary:    Comments: Deferred with shared decision making. Self swab performed.  Neurological:     General: No focal deficit present.     Mental Status: He is alert and oriented to person, place, and time. Mental status is at baseline.  Psychiatric:        Mood and Affect: Mood normal.        Behavior: Behavior normal.        Thought Content: Thought content normal.        Judgment: Judgment normal.     UC Treatments / Results  Labs (all labs ordered are listed, but only abnormal results are displayed) Labs Reviewed  CYTOLOGY, (ORAL, ANAL, URETHRAL) ANCILLARY ONLY    EKG   Radiology No results found.  Procedures Procedures (including critical care  time)  Medications Ordered in UC Medications - No data to display  Initial Impression / Assessment and Plan / UC Course  I have reviewed the triage vital signs and the nursing notes.  Pertinent labs & imaging results that were available during my care of the patient were reviewed by me and considered in my medical decision making (see chart for details).     Cytology swab pending.  Will await results for treatment at this time.  Patient to refrain from sexual activity until test results and treatment are complete.  Patient verbalized understanding and was agreeable with plan.  Final Clinical Impressions(s) / UC Diagnoses   Final diagnoses:  Penile discharge  Screening examination for venereal disease     Discharge Instructions      Your penile swab is pending.  We will call if it is positive and treat as appropriate.  Please refrain from sexual activity until test results and treatment are complete.    ED Prescriptions   None    PDMP not reviewed this encounter.   Teodora Medici, [place] 11/01/21 1047

## 2021-11-01 NOTE — Discharge Instructions (Signed)
Your penile swab is pending.  We will call if it is positive and treat as appropriate.  Please refrain from sexual activity until test results and treatment are complete. °

## 2021-11-02 ENCOUNTER — Ambulatory Visit: Payer: Self-pay

## 2021-11-03 LAB — CYTOLOGY, (ORAL, ANAL, URETHRAL) ANCILLARY ONLY
Chlamydia: NEGATIVE
Comment: NEGATIVE
Comment: NEGATIVE
Comment: NORMAL
Neisseria Gonorrhea: NEGATIVE
Trichomonas: NEGATIVE

## 2022-03-16 IMAGING — CR DG CHEST 2V
2 series · 2 of 2 positions shown · non-contrast
Comparison: None.

CLINICAL DATA: Left-sided chest pain and shortness of breath

EXAM:
CHEST - 2 VIEW

[w chest pa]
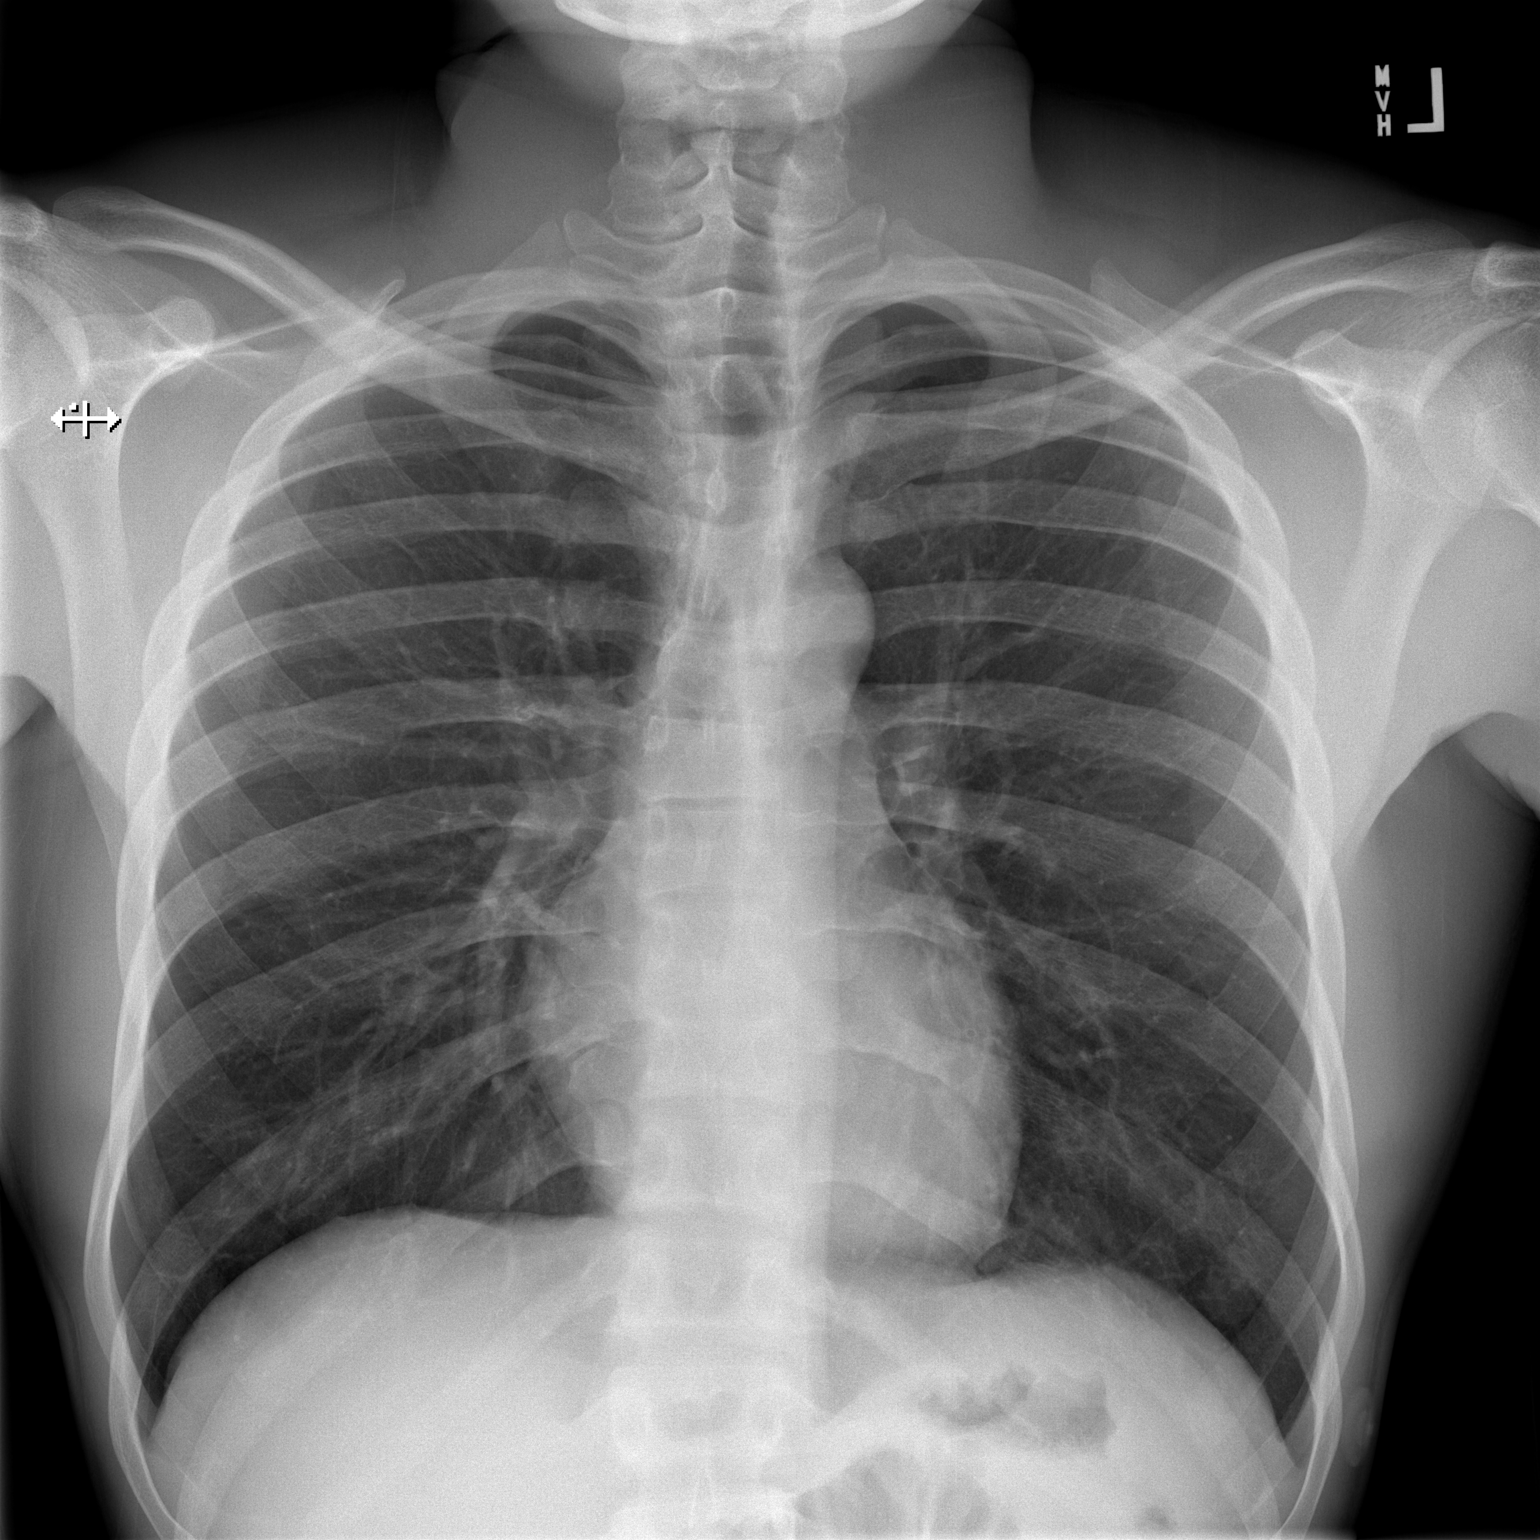

[w chest lat]
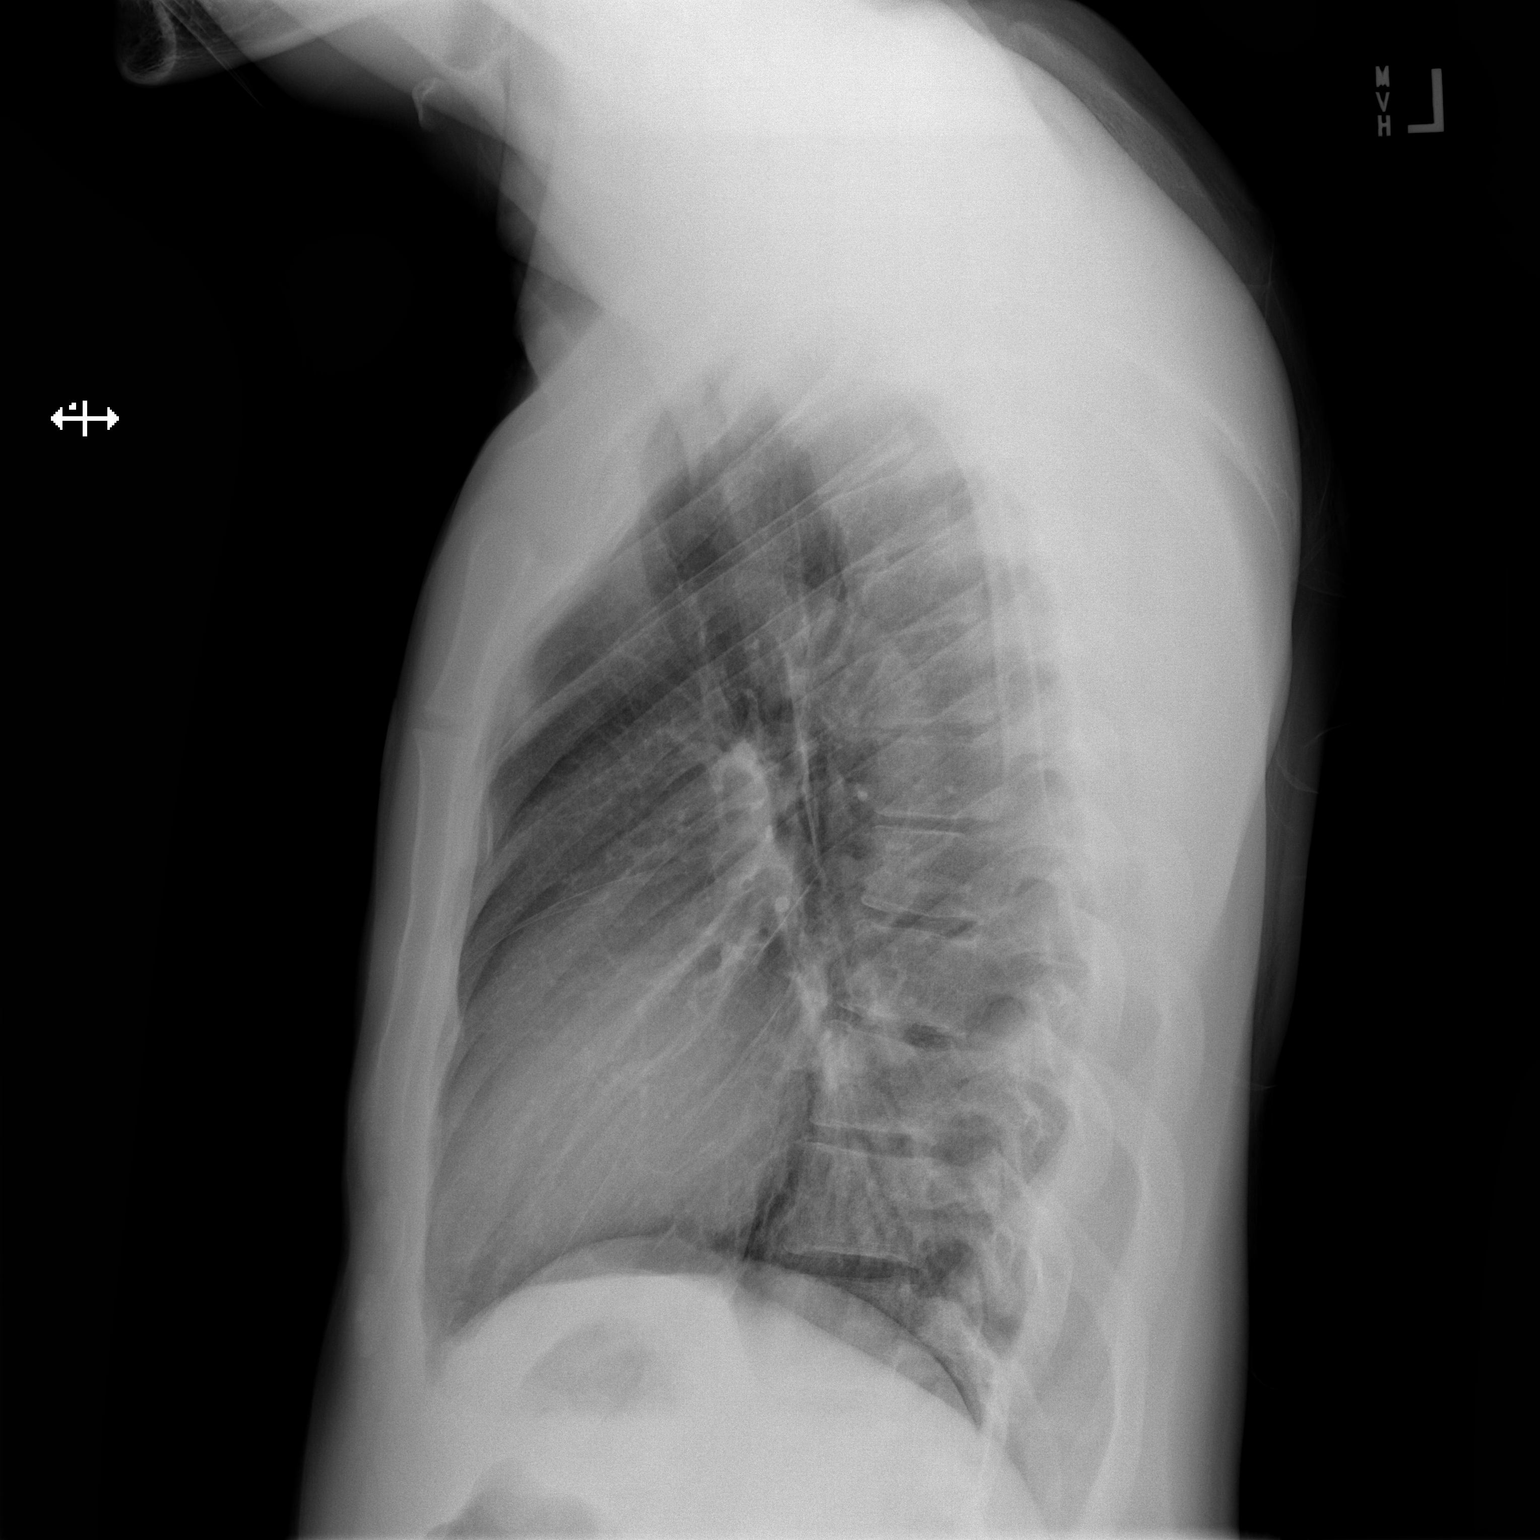

[2 of 2 positions shown; findings below may reference images not displayed]

FINDINGS: The heart size and mediastinal contours are within normal limits.
Both lungs are clear. The visualized skeletal structures are
unremarkable.
IMPRESSION: No active cardiopulmonary disease.

## 2023-03-02 ENCOUNTER — Other Ambulatory Visit: Payer: Self-pay

## 2023-03-02 ENCOUNTER — Ambulatory Visit
Admission: RE | Admit: 2023-03-02 | Discharge: 2023-03-02 | Disposition: A | Discharge status: Home / Self Care | Payer: Self-pay | Source: Ambulatory Visit | Attending: Physician Assistant | Admitting: Physician Assistant

## 2023-03-02 VITALS — BP 120/78 | HR 76 | Temp 98.3°F | Ht 75.0 in | Wt 185.0 lb

## 2023-03-02 DIAGNOSIS — K644 Residual hemorrhoidal skin tags: Secondary | ICD-10-CM

## 2023-03-02 MED ORDER — HYDROCORTISONE (PERIANAL) 2.5 % EX CREA: 1.0000 | TOPICAL_CREAM | Freq: Two times a day (BID) | CUTANEOUS | 0 refills | Status: AC

## 2023-03-02 NOTE — ED Provider Notes (Signed)
[facility]    CSN: [id number] Arrival date & time: 03/02/23  1253      History   Chief Complaint Chief Complaint  Patient presents with   Exposure to STD    I have a bump beside my anus and i don't know how but it made me nervous. - Entered by patient    HPI Christopher Larsen is a 22 y.o. male.   Patient here today evaluation of a bump beside his anus.  He has never had anything like this before.  He denies any pain associated with same.  He has not any drainage or bleeding.  He does not report treatment for symptoms.  He states he first noticed symptoms 3 days ago.  The history is provided by the patient.  Exposure to STD Pertinent negatives include no shortness of breath.    History reviewed. No pertinent past medical history.  Patient Active Problem List   Diagnosis Date Noted   DIZZINESS 08/13/2008   DYSARTHRIA 08/13/2008   HEADACHE 08/06/2008   DERMATITIS, ATOPIC 05/19/2007   RHINITIS, ALLERGIC 07/29/2006    Past Surgical History:  Procedure Laterality Date   HERNIA REPAIR         Home Medications    Prior to Admission medications   Medication Sig Start Date End Date Taking? Authorizing Provider  hydrocortisone (ANUSOL-HC) 2.5 % rectal cream Place 1 Application rectally 2 (two) times daily. 03/02/23  Yes Tomi Bamberger, PA-C  ibuprofen (ADVIL,MOTRIN) 600 MG tablet Take 1 tablet (600 mg total) by mouth every 6 (six) hours as needed. 01/18/16   Eliseo Squires, PA-C  ondansetron (ZOFRAN-ODT) 8 MG disintegrating tablet Take 1 tablet (8 mg total) by mouth every 8 (eight) hours as needed for nausea or vomiting. 02/24/21   Wallis Bamberg, PA-C  oseltamivir (TAMIFLU) 75 MG capsule Take 1 capsule (75 mg total) by mouth every 12 (twelve) hours. 06/26/21   Lurline Idol, FNP  SUMAtriptan (IMITREX) 25 MG tablet Take 1 tablet at the start of a migraine. If the pain persists after 2 hours take 1 more tablet. Do not exceed 2 doses in 24 hours. 02/24/21    Wallis Bamberg, PA-C    Family History Family History  Problem Relation Age of Onset   Healthy Father    Healthy Brother     Social History Social History   Tobacco Use   Smoking status: Never   Smokeless tobacco: Never  Vaping Use   Vaping status: Never Used  Substance Use Topics   Alcohol use: Yes   Drug use: No     Allergies   Patient has no known allergies.   Review of Systems Review of Systems  Constitutional:  Negative for chills and fever.  Eyes:  Negative for discharge and redness.  Respiratory:  Negative for shortness of breath.   Gastrointestinal:  Negative for nausea and vomiting.  Genitourinary:  Negative for penile discharge.  Skin:  Negative for color change and wound.  Neurological:  Negative for numbness.     Physical Exam Triage Vital Signs ED Triage Vitals  Encounter Vitals Group     BP 03/02/23 1335 120/78     Systolic BP Percentile --      Diastolic BP Percentile --      Pulse Rate 03/02/23 1335 76     Resp --      Temp 03/02/23 1335 98.3 F (36.8 C)     Temp Source 03/02/23 1335 Oral     SpO2 03/02/23  1335 98 %     Weight 03/02/23 1333 185 lb (83.9 kg)     Height 03/02/23 1333 6\' 3"  (1.905 m)     Head Circumference --      Peak Flow --      Pain Score 03/02/23 1333 0     Pain Loc --      Pain Education --      Exclude from Growth Chart --    No data found.  Updated Vital Signs BP 120/78 (BP Location: Left Arm)   Pulse 76   Temp 98.3 F (36.8 C) (Oral)   Ht 6\' 3"  (1.905 m)   Wt 185 lb (83.9 kg)   SpO2 98%   BMI 23.12 kg/m      Physical Exam Vitals and nursing note reviewed.  Constitutional:      General: He is not in acute distress.    Appearance: Normal appearance. He is not ill-appearing.  HENT:     Head: Normocephalic and atraumatic.  Eyes:     Conjunctiva/sclera: Conjunctivae normal.  Cardiovascular:     Rate and Rhythm: Normal rate.  Pulmonary:     Effort: Pulmonary effort is normal. No respiratory  distress.  Genitourinary:    Comments: Single flesh toned hemorrhoid noted to anus, no active bleeding. Neurological:     Mental Status: He is alert.  Psychiatric:        Mood and Affect: Mood normal.        Behavior: Behavior normal.        Thought Content: Thought content normal.      UC Treatments / Results  Labs (all labs ordered are listed, but only abnormal results are displayed) Labs Reviewed - No data to display  EKG   Radiology No results found.  Procedures Procedures (including critical care time)  Medications Ordered in UC Medications - No data to display  Initial Impression / Assessment and Plan / UC Course  I have reviewed the triage vital signs and the nursing notes.  Pertinent labs & imaging results that were available during my care of the patient were reviewed by me and considered in my medical decision making (see chart for details).    Discussed appearance most consistent with hemorrhoid and recommended steroid topically.  Recommended follow-up if no gradual improvement or with any worsening symptoms including pain.  Patient expresses understanding.  Final Clinical Impressions(s) / UC Diagnoses   Final diagnoses:  External hemorrhoid   Discharge Instructions   None    ED Prescriptions     Medication Sig Dispense Auth. Provider   hydrocortisone (ANUSOL-HC) 2.5 % rectal cream Place 1 Application rectally 2 (two) times daily. 30 g Tomi Bamberger, PA-C      PDMP not reviewed this encounter.   Tomi Bamberger, PA-C 03/02/23 1351

## 2023-03-02 NOTE — ED Triage Notes (Signed)
Patient presents with a bump in the inside of buttocks x day 3. States he have been using a warm cloth to ease pain.

## 2023-08-13 ENCOUNTER — Other Ambulatory Visit: Payer: Self-pay

## 2023-08-13 ENCOUNTER — Encounter (HOSPITAL_BASED_OUTPATIENT_CLINIC_OR_DEPARTMENT_OTHER): Payer: Self-pay | Admitting: Emergency Medicine

## 2023-08-13 DIAGNOSIS — Z5321 Procedure and treatment not carried out due to patient leaving prior to being seen by health care provider: Secondary | ICD-10-CM | POA: Insufficient documentation

## 2023-08-13 DIAGNOSIS — R112 Nausea with vomiting, unspecified: Secondary | ICD-10-CM | POA: Insufficient documentation

## 2023-08-13 LAB — COMPREHENSIVE METABOLIC PANEL
ALT: 76 U/L — ABNORMAL HIGH (ref 0–44)
AST: 43 U/L — ABNORMAL HIGH (ref 15–41)
Albumin: 5 g/dL (ref 3.5–5.0)
Alkaline Phosphatase: 77 U/L (ref 38–126)
Anion gap: 13 (ref 5–15)
BUN: 22 mg/dL — ABNORMAL HIGH (ref 6–20)
CO2: 23 mmol/L (ref 22–32)
Calcium: 9.8 mg/dL (ref 8.9–10.3)
Chloride: 104 mmol/L (ref 98–111)
Creatinine, Ser: 0.95 mg/dL (ref 0.61–1.24)
GFR, Estimated: >60 mL/min (ref >60)
Glucose, Bld: 113 mg/dL — ABNORMAL HIGH (ref 70–99)
Potassium: 3.7 mmol/L (ref 3.5–5.1)
Sodium: 140 mmol/L (ref 135–145)
Total Bilirubin: 1.2 mg/dL (ref 0.0–1.2)
Total Protein: 8.5 g/dL — ABNORMAL HIGH (ref 6.5–8.1)

## 2023-08-13 LAB — CBC WITH DIFFERENTIAL/PLATELET
Abs Immature Granulocytes: 0.03 10*3/uL (ref 0.00–0.07)
Basophils Absolute: 0 10*3/uL (ref 0.0–0.1)
Basophils Relative: 0 %
Eosinophils Absolute: 0 10*3/uL (ref 0.0–0.5)
Eosinophils Relative: 0 %
HCT: 47.5 % (ref 39.0–52.0)
Hemoglobin: 16.5 g/dL (ref 13.0–17.0)
Immature Granulocytes: 0 %
Lymphocytes Relative: 4 %
Lymphs Abs: 0.5 10*3/uL — ABNORMAL LOW (ref 0.7–4.0)
MCH: 31 pg (ref 26.0–34.0)
MCHC: 34.7 g/dL (ref 30.0–36.0)
MCV: 89.1 fL (ref 80.0–100.0)
Monocytes Absolute: 0.2 10*3/uL (ref 0.1–1.0)
Monocytes Relative: 2 %
Neutro Abs: 10.1 10*3/uL — ABNORMAL HIGH (ref 1.7–7.7)
Neutrophils Relative %: 94 %
Platelets: 159 10*3/uL (ref 150–400)
RBC: 5.33 MIL/uL (ref 4.22–5.81)
RDW: 12.8 % (ref 11.5–15.5)
WBC: 10.8 10*3/uL — ABNORMAL HIGH (ref 4.0–10.5)
nRBC: 0 % (ref 0.0–0.2)

## 2023-08-13 LAB — ETHANOL: Alcohol, Ethyl (B): <10 mg/dL (ref <10)

## 2023-08-13 LAB — LIPASE, BLOOD: Lipase: 23 U/L (ref 11–51)

## 2023-08-13 MED ORDER — ONDANSETRON 4 MG PO TBDP
4.0000 mg | ORAL_TABLET | Freq: Once | ORAL | Status: AC | PRN
  Administered 2023-08-13: 4 mg via ORAL

## 2023-08-13 NOTE — ED Triage Notes (Signed)
 Patient reports n/v "all day" Endorses heavy drinking last night. Concerned for alcohol poisoning.

## 2023-08-14 ENCOUNTER — Emergency Department (HOSPITAL_BASED_OUTPATIENT_CLINIC_OR_DEPARTMENT_OTHER)
Admission: EM | Admit: 2023-08-14 | Discharge: 2023-08-14 | Discharge status: Against Medical Advice | Payer: Self-pay | Attending: Emergency Medicine | Admitting: Emergency Medicine

## 2023-09-02 ENCOUNTER — Ambulatory Visit: Payer: Self-pay

## 2023-09-02 ENCOUNTER — Ambulatory Visit
Admission: EM | Admit: 2023-09-02 | Discharge: 2023-09-02 | Disposition: A | Discharge status: Home / Self Care | Payer: Self-pay | Attending: Physician Assistant | Admitting: Physician Assistant

## 2023-09-02 DIAGNOSIS — R0789 Other chest pain: Secondary | ICD-10-CM

## 2023-09-02 MED ORDER — CYCLOBENZAPRINE HCL 10 MG PO TABS: 10.0000 mg | ORAL_TABLET | Freq: Two times a day (BID) | ORAL | 0 refills | Status: AC | PRN

## 2023-09-02 NOTE — ED Triage Notes (Signed)
 No room to do intake/vital signs for patient. No current pain in chest "but at night when it happens it is a 3". No sob. No obvious distress. No injury.

## 2023-09-02 NOTE — ED Provider Notes (Signed)
 [facility]    CSN: [id number] Arrival date & time: 09/02/23  4431      History   Chief Complaint Chief Complaint  Patient presents with   Chest Pain    HPI Christopher Larsen is a 23 y.o. male.   Patient here today for evaluation of left sided chest discomfort he noted last night. He reports pain was a 3/10 when he was lying down but he has not pain now. He did not have any shortness of breath, lightheadedness, wheezing or other symptoms. He has not had any recent illness. He reports pain feels like muscle spasm. He has not had any known injury.   The history is provided by the patient.  Chest Pain Associated symptoms: no cough, no dizziness, no fever, no nausea, no shortness of breath and no vomiting     History reviewed. No pertinent past medical history.  Patient Active Problem List   Diagnosis Date Noted   DIZZINESS 08/13/2008   DYSARTHRIA 08/13/2008   Headache 08/06/2008   DERMATITIS, ATOPIC 05/19/2007   Allergic rhinitis 07/29/2006    History reviewed. No pertinent surgical history.     Home Medications    Prior to Admission medications   Medication Sig Start Date End Date Taking? Authorizing Provider  cyclobenzaprine (FLEXERIL) 10 MG tablet Take 1 tablet (10 mg total) by mouth 2 (two) times daily as needed for muscle spasms. 09/02/23  Yes Tomi Bamberger, PA-C  Pseudoeph-Doxylamine-DM-APAP (NYQUIL PO) Take by mouth.   Yes [provider]  hydrocortisone (ANUSOL-HC) 2.5 % rectal cream Place 1 Application rectally 2 (two) times daily. 03/02/23   Tomi Bamberger, PA-C  ibuprofen (ADVIL,MOTRIN) 600 MG tablet Take 1 tablet (600 mg total) by mouth every 6 (six) hours as needed. 01/18/16   Eliseo Squires, PA-C  ondansetron (ZOFRAN-ODT) 8 MG disintegrating tablet Take 1 tablet (8 mg total) by mouth every 8 (eight) hours as needed for nausea or vomiting. 02/24/21   Wallis Bamberg, PA-C  oseltamivir (TAMIFLU) 75 MG capsule Take 1 capsule (75 mg total)  by mouth every 12 (twelve) hours. 06/26/21   Lurline Idol, FNP  SUMAtriptan (IMITREX) 25 MG tablet Take 1 tablet at the start of a migraine. If the pain persists after 2 hours take 1 more tablet. Do not exceed 2 doses in 24 hours. 02/24/21   Wallis Bamberg, PA-C    Family History Family History  Problem Relation Age of Onset   Healthy Father    Healthy Brother     Social History Social History   Tobacco Use   Smoking status: Never   Smokeless tobacco: Never  Vaping Use   Vaping status: Never Used  Substance Use Topics   Alcohol use: Not Currently   Drug use: Yes    Frequency: 3.0 times per week    Types: Marijuana     Allergies   Patient has no known allergies.   Review of Systems Review of Systems  Constitutional:  Negative for chills and fever.  HENT:  Negative for congestion.   Eyes:  Negative for discharge and redness.  Respiratory:  Negative for cough, chest tightness, shortness of breath and wheezing.   Cardiovascular:  Positive for chest pain.  Gastrointestinal:  Negative for nausea and vomiting.  Neurological:  Negative for dizziness and light-headedness.     Physical Exam Triage Vital Signs ED Triage Vitals  Encounter Vitals Group     BP 09/02/23 1006 98/66     Systolic BP Percentile --  Diastolic BP Percentile --      Pulse Rate 09/02/23 0948 77     Resp 09/02/23 0948 18     Temp 09/02/23 1006 (!) 97.5 F (36.4 C)     Temp Source 09/02/23 1006 Oral     SpO2 09/02/23 0948 97 %     Weight 09/02/23 1005 180 lb (81.6 kg)     Height 09/02/23 1005 6\' 3"  (1.905 m)     Head Circumference --      Peak Flow --      Pain Score 09/02/23 1001 0     Pain Loc --      Pain Education --      Exclude from Growth Chart --    No data found.  Updated Vital Signs BP 98/66 (BP Location: Right Arm)   Pulse 65   Temp (!) 97.5 F (36.4 C) (Oral)   Resp 18   Ht 6\' 3"  (1.905 m)   Wt 180 lb (81.6 kg)   SpO2 98%   BMI 22.50 kg/m   Visual Acuity Right  Eye Distance:   Left Eye Distance:   Bilateral Distance:    Right Eye Near:   Left Eye Near:    Bilateral Near:     Physical Exam Vitals and nursing note reviewed.  Constitutional:      General: He is not in acute distress.    Appearance: He is well-developed. He is not ill-appearing.  HENT:     Head: Normocephalic and atraumatic.     Nose: Nose normal. No congestion or rhinorrhea.  Eyes:     Conjunctiva/sclera: Conjunctivae normal.  Cardiovascular:     Rate and Rhythm: Normal rate and regular rhythm.  Pulmonary:     Effort: Pulmonary effort is normal. No respiratory distress.     Breath sounds: No wheezing, rhonchi or rales.  Chest:     Chest wall: No tenderness.  Neurological:     Mental Status: He is alert.  Psychiatric:        Mood and Affect: Mood normal.        Behavior: Behavior normal.      UC Treatments / Results  Labs (all labs ordered are listed, but only abnormal results are displayed) Labs Reviewed - No data to display  EKG   Radiology No results found.  Procedures Procedures (including critical care time)  Medications Ordered in UC Medications - No data to display  Initial Impression / Assessment and Plan / UC Course  I have reviewed the triage vital signs and the nursing notes.  Pertinent labs & imaging results that were available during my care of the patient were reviewed by me and considered in my medical decision making (see chart for details).    EKG with possible sinus arrhythmia but no other concerning findings.  Discussed that since pain was not reproducible there was some concern for possible cardiac etiology but this does seem unlikely given lack of other symptoms and risk factors. Most likely muscle strain and will treat with muscle relaxer. Advised ED if symptoms persist or worsen.   Final Clinical Impressions(s) / UC Diagnoses   Final diagnoses:  Atypical chest pain   Discharge Instructions   None    ED Prescriptions      Medication Sig Dispense Auth. Provider   cyclobenzaprine (FLEXERIL) 10 MG tablet Take 1 tablet (10 mg total) by mouth 2 (two) times daily as needed for muscle spasms. 20 tablet Tomi Bamberger, PA-C  PDMP not reviewed this encounter.   Tomi Bamberger, PA-C 09/02/23 [phone]

## 2023-09-02 NOTE — ED Triage Notes (Signed)
"  I am having recurrent pain in my left chest (like muscle spasm) but worse at night when lying flat". None right now "but comes and goes". No sob. No wheezing. No recent illness.

## 2023-10-14 ENCOUNTER — Ambulatory Visit: Payer: Self-pay
